# Patient Record
Sex: Male | Born: 1935 | Race: White | Hispanic: No | Marital: Married | Smoking: Former smoker
Health system: Southern US, Community
[De-identification: ages and names within clinical notes are randomized; demographics above are authoritative.]

## PROBLEM LIST (undated history)

## (undated) DIAGNOSIS — I10 Essential (primary) hypertension: Secondary | ICD-10-CM

## (undated) DIAGNOSIS — E785 Hyperlipidemia, unspecified: Secondary | ICD-10-CM

## (undated) DIAGNOSIS — I2510 Atherosclerotic heart disease of native coronary artery without angina pectoris: Secondary | ICD-10-CM

## (undated) DIAGNOSIS — N183 Chronic kidney disease, stage 3 unspecified: Secondary | ICD-10-CM

## (undated) DIAGNOSIS — E119 Type 2 diabetes mellitus without complications: Secondary | ICD-10-CM

## (undated) DIAGNOSIS — K219 Gastro-esophageal reflux disease without esophagitis: Secondary | ICD-10-CM

## (undated) HISTORY — PX: CATARACT EXTRACTION W/ INTRAOCULAR LENS  IMPLANT, BILATERAL: SHX1307

## (undated) HISTORY — PX: CARDIAC CATHETERIZATION: SHX172

---

## 2006-08-27 ENCOUNTER — Ambulatory Visit: Payer: Self-pay | Admitting: Ophthalmology

## 2006-09-02 ENCOUNTER — Ambulatory Visit: Payer: Self-pay | Admitting: Ophthalmology

## 2008-01-11 ENCOUNTER — Ambulatory Visit: Payer: Self-pay | Admitting: Internal Medicine

## 2008-02-08 ENCOUNTER — Ambulatory Visit: Payer: Self-pay | Admitting: Gastroenterology

## 2011-11-23 ENCOUNTER — Inpatient Hospital Stay: Payer: Self-pay | Admitting: Internal Medicine

## 2011-11-23 LAB — COMPREHENSIVE METABOLIC PANEL
Albumin: 3.8 g/dL (ref 3.4–5.0)
Anion Gap: 14 (ref 7–16)
Bilirubin,Total: 0.8 mg/dL (ref 0.2–1.0)
Calcium, Total: 9 mg/dL (ref 8.5–10.1)
Chloride: 103 mmol/L (ref 98–107)
Co2: 23 mmol/L (ref 21–32)
EGFR (Non-African Amer.): 44 — ABNORMAL LOW
Glucose: 321 mg/dL — ABNORMAL HIGH (ref 65–99)
Osmolality: 295 (ref 275–301)
Potassium: 3.4 mmol/L — ABNORMAL LOW (ref 3.5–5.1)
SGOT(AST): 34 U/L (ref 15–37)
Sodium: 140 mmol/L (ref 136–145)

## 2011-11-23 LAB — CBC
HCT: 36.1 % — ABNORMAL LOW (ref 40.0–52.0)
HGB: 12.3 g/dL — ABNORMAL LOW (ref 13.0–18.0)
MCH: 30.8 pg (ref 26.0–34.0)
MCHC: 34.1 g/dL (ref 32.0–36.0)
MCV: 90 fL (ref 80–100)
RBC: 4 10*6/uL — ABNORMAL LOW (ref 4.40–5.90)
WBC: 16.6 10*3/uL — ABNORMAL HIGH (ref 3.8–10.6)

## 2011-11-23 LAB — PRO B NATRIURETIC PEPTIDE: B-Type Natriuretic Peptide: 2328 pg/mL — ABNORMAL HIGH (ref 0–450)

## 2011-11-23 LAB — CK TOTAL AND CKMB (NOT AT ARMC)
CK, Total: 170 U/L (ref 35–232)
CK, Total: 928 U/L — ABNORMAL HIGH (ref 35–232)
CK-MB: 4.7 ng/mL — ABNORMAL HIGH (ref 0.5–3.6)
CK-MB: 84.8 ng/mL — ABNORMAL HIGH (ref 0.5–3.6)
CK-MB: 33 ng/mL — ABNORMAL HIGH (ref 0.5–3.6)

## 2011-11-23 LAB — PROTIME-INR: INR: 1.1

## 2011-11-23 LAB — APTT: Activated PTT: 26.9 secs (ref 23.6–35.9)

## 2011-11-23 LAB — TROPONIN I
Troponin-I: 0.91 ng/mL — ABNORMAL HIGH
Troponin-I: 19 ng/mL — ABNORMAL HIGH

## 2011-11-24 LAB — CBC WITH DIFFERENTIAL/PLATELET
Basophil #: 0 10*3/uL (ref 0.0–0.1)
Basophil %: 0.1 %
Eosinophil #: 0 10*3/uL (ref 0.0–0.7)
Eosinophil %: 0.2 %
HCT: 31.6 % — ABNORMAL LOW (ref 40.0–52.0)
Lymphocyte #: 1.6 10*3/uL (ref 1.0–3.6)
Lymphocyte %: 15.1 %
MCH: 30.6 pg (ref 26.0–34.0)
MCHC: 33.9 g/dL (ref 32.0–36.0)
MCV: 90 fL (ref 80–100)
Monocyte #: 1.3 10*3/uL — ABNORMAL HIGH (ref 0.0–0.7)
Monocyte %: 12.1 %
Neutrophil %: 72.5 %
Platelet: 155 10*3/uL (ref 150–440)
RBC: 3.5 10*6/uL — ABNORMAL LOW (ref 4.40–5.90)
RDW: 14.6 % — ABNORMAL HIGH (ref 11.5–14.5)
WBC: 10.3 10*3/uL (ref 3.8–10.6)

## 2011-11-24 LAB — BASIC METABOLIC PANEL
Anion Gap: 13 (ref 7–16)
Calcium, Total: 8.6 mg/dL (ref 8.5–10.1)
Chloride: 97 mmol/L — ABNORMAL LOW (ref 98–107)
Co2: 27 mmol/L (ref 21–32)
Creatinine: 1.72 mg/dL — ABNORMAL HIGH (ref 0.60–1.30)
Sodium: 137 mmol/L (ref 136–145)

## 2011-11-24 LAB — LIPID PANEL
Cholesterol: 130 mg/dL (ref 0–200)
HDL Cholesterol: 47 mg/dL (ref 40–60)
Ldl Cholesterol, Calc: 54 mg/dL (ref 0–100)
Triglycerides: 147 mg/dL (ref 0–200)
VLDL Cholesterol, Calc: 29 mg/dL (ref 5–40)

## 2011-11-25 LAB — BASIC METABOLIC PANEL
BUN: 42 mg/dL — ABNORMAL HIGH (ref 7–18)
Chloride: 98 mmol/L (ref 98–107)
Co2: 26 mmol/L (ref 21–32)
Creatinine: 2.8 mg/dL — ABNORMAL HIGH (ref 0.60–1.30)
EGFR (Non-African Amer.): 23 — ABNORMAL LOW
Potassium: 4.2 mmol/L (ref 3.5–5.1)
Sodium: 137 mmol/L (ref 136–145)

## 2011-11-25 LAB — MAGNESIUM: Magnesium: 2.1 mg/dL

## 2011-11-25 LAB — CBC WITH DIFFERENTIAL/PLATELET
Basophil #: 0 10*3/uL (ref 0.0–0.1)
Eosinophil #: 0.1 10*3/uL (ref 0.0–0.7)
Lymphocyte #: 1.3 10*3/uL (ref 1.0–3.6)
Monocyte #: 1.1 10*3/uL — ABNORMAL HIGH (ref 0.0–0.7)
Neutrophil %: 68.3 %
Platelet: 155 10*3/uL (ref 150–440)
RDW: 14.4 % (ref 11.5–14.5)
WBC: 8 10*3/uL (ref 3.8–10.6)

## 2011-11-26 LAB — CBC WITH DIFFERENTIAL/PLATELET
Basophil %: 0.4 %
Eosinophil %: 4.4 %
HGB: 10.1 g/dL — ABNORMAL LOW (ref 13.0–18.0)
Lymphocyte %: 18.2 %
Monocyte %: 12.4 %
Neutrophil %: 64.6 %
Platelet: 168 10*3/uL (ref 150–440)
RBC: 3.26 10*6/uL — ABNORMAL LOW (ref 4.40–5.90)
WBC: 7.3 10*3/uL (ref 3.8–10.6)

## 2011-11-26 LAB — BASIC METABOLIC PANEL
Anion Gap: 11 (ref 7–16)
BUN: 19 mg/dL — ABNORMAL HIGH (ref 7–18)
Calcium, Total: 8.3 mg/dL — ABNORMAL LOW (ref 8.5–10.1)
Co2: 25 mmol/L (ref 21–32)
Creatinine: 3.62 mg/dL — ABNORMAL HIGH (ref 0.60–1.30)
EGFR (Non-African Amer.): 17 — ABNORMAL LOW
Glucose: 102 mg/dL — ABNORMAL HIGH (ref 65–99)
Osmolality: 276 (ref 275–301)
Potassium: 4.4 mmol/L (ref 3.5–5.1)
Sodium: 137 mmol/L (ref 136–145)

## 2011-11-27 LAB — CBC WITH DIFFERENTIAL/PLATELET
Basophil %: 0.6 %
Eosinophil #: 0.6 10*3/uL (ref 0.0–0.7)
Eosinophil %: 8.8 %
HCT: 29.7 % — ABNORMAL LOW (ref 40.0–52.0)
Lymphocyte #: 1.4 10*3/uL (ref 1.0–3.6)
MCH: 30.7 pg (ref 26.0–34.0)
MCHC: 33.5 g/dL (ref 32.0–36.0)
MCV: 92 fL (ref 80–100)
Monocyte #: 0.8 10*3/uL — ABNORMAL HIGH (ref 0.0–0.7)
Monocyte %: 11.3 %
Neutrophil #: 4.4 10*3/uL (ref 1.4–6.5)
Neutrophil %: 60.4 %
Platelet: 178 10*3/uL (ref 150–440)
RBC: 3.24 10*6/uL — ABNORMAL LOW (ref 4.40–5.90)
RDW: 14.2 % (ref 11.5–14.5)
WBC: 7.2 10*3/uL (ref 3.8–10.6)

## 2011-11-27 LAB — BASIC METABOLIC PANEL
Anion Gap: 12 (ref 7–16)
BUN: 51 mg/dL — ABNORMAL HIGH (ref 7–18)
Calcium, Total: 8.2 mg/dL — ABNORMAL LOW (ref 8.5–10.1)
Chloride: 105 mmol/L (ref 98–107)
Co2: 23 mmol/L (ref 21–32)
Glucose: 109 mg/dL — ABNORMAL HIGH (ref 65–99)
Osmolality: 294 (ref 275–301)
Potassium: 4.5 mmol/L (ref 3.5–5.1)
Sodium: 140 mmol/L (ref 136–145)

## 2011-11-27 LAB — URINALYSIS, COMPLETE
Bilirubin,UR: NEGATIVE
Ketone: NEGATIVE
Ph: 6 (ref 4.5–8.0)
Protein: 30
Specific Gravity: 1.01 (ref 1.003–1.030)
Squamous Epithelial: NONE SEEN
WBC UR: 4 /HPF (ref 0–5)

## 2011-11-27 LAB — IRON AND TIBC
Iron: 22 ug/dL — ABNORMAL LOW (ref 65–175)
Unbound Iron-Bind.Cap.: 281 ug/dL

## 2011-11-27 LAB — PHOSPHORUS: Phosphorus: 3.9 mg/dL (ref 2.5–4.9)

## 2011-11-27 LAB — PROTEIN / CREATININE RATIO, URINE
Creatinine, Urine: 54.5 mg/dL (ref 30.0–125.0)
Protein, Random Urine: 45 mg/dL — ABNORMAL HIGH (ref 0–12)
Protein/Creat. Ratio: 826 mg/gCREAT — ABNORMAL HIGH (ref 0–200)

## 2011-11-28 LAB — CBC WITH DIFFERENTIAL/PLATELET
Basophil #: 0.1 10*3/uL (ref 0.0–0.1)
Basophil %: 0.9 %
Eosinophil %: 9.8 %
HGB: 10.1 g/dL — ABNORMAL LOW (ref 13.0–18.0)
Lymphocyte %: 18.7 %
MCH: 32.3 pg (ref 26.0–34.0)
MCHC: 34 g/dL (ref 32.0–36.0)
Monocyte #: 0.9 10*3/uL — ABNORMAL HIGH (ref 0.0–0.7)
Monocyte %: 10.8 %
Neutrophil %: 59.8 %
Platelet: 224 10*3/uL (ref 150–440)
RBC: 3.13 10*6/uL — ABNORMAL LOW (ref 4.40–5.90)
WBC: 8.5 10*3/uL (ref 3.8–10.6)

## 2011-11-28 LAB — BASIC METABOLIC PANEL
BUN: 39 mg/dL — ABNORMAL HIGH (ref 7–18)
Chloride: 108 mmol/L — ABNORMAL HIGH (ref 98–107)
Creatinine: 2.2 mg/dL — ABNORMAL HIGH (ref 0.60–1.30)
EGFR (African American): 37 — ABNORMAL LOW
EGFR (Non-African Amer.): 31 — ABNORMAL LOW
Glucose: 91 mg/dL (ref 65–99)
Osmolality: 288 (ref 275–301)
Potassium: 4.3 mmol/L (ref 3.5–5.1)
Sodium: 140 mmol/L (ref 136–145)

## 2011-11-28 LAB — PROTEIN ELECTROPHORESIS(ARMC)

## 2011-12-01 LAB — UR PROT ELECTROPHORESIS, URINE RANDOM

## 2012-07-28 ENCOUNTER — Ambulatory Visit: Payer: Self-pay | Admitting: Nephrology

## 2013-03-25 IMAGING — CR DG CHEST 1V PORT
1 series · 1 of 1 positions shown · non-contrast
Comparison: none

REASON FOR EXAM: dyspnea
COMMENTS:

[portable]
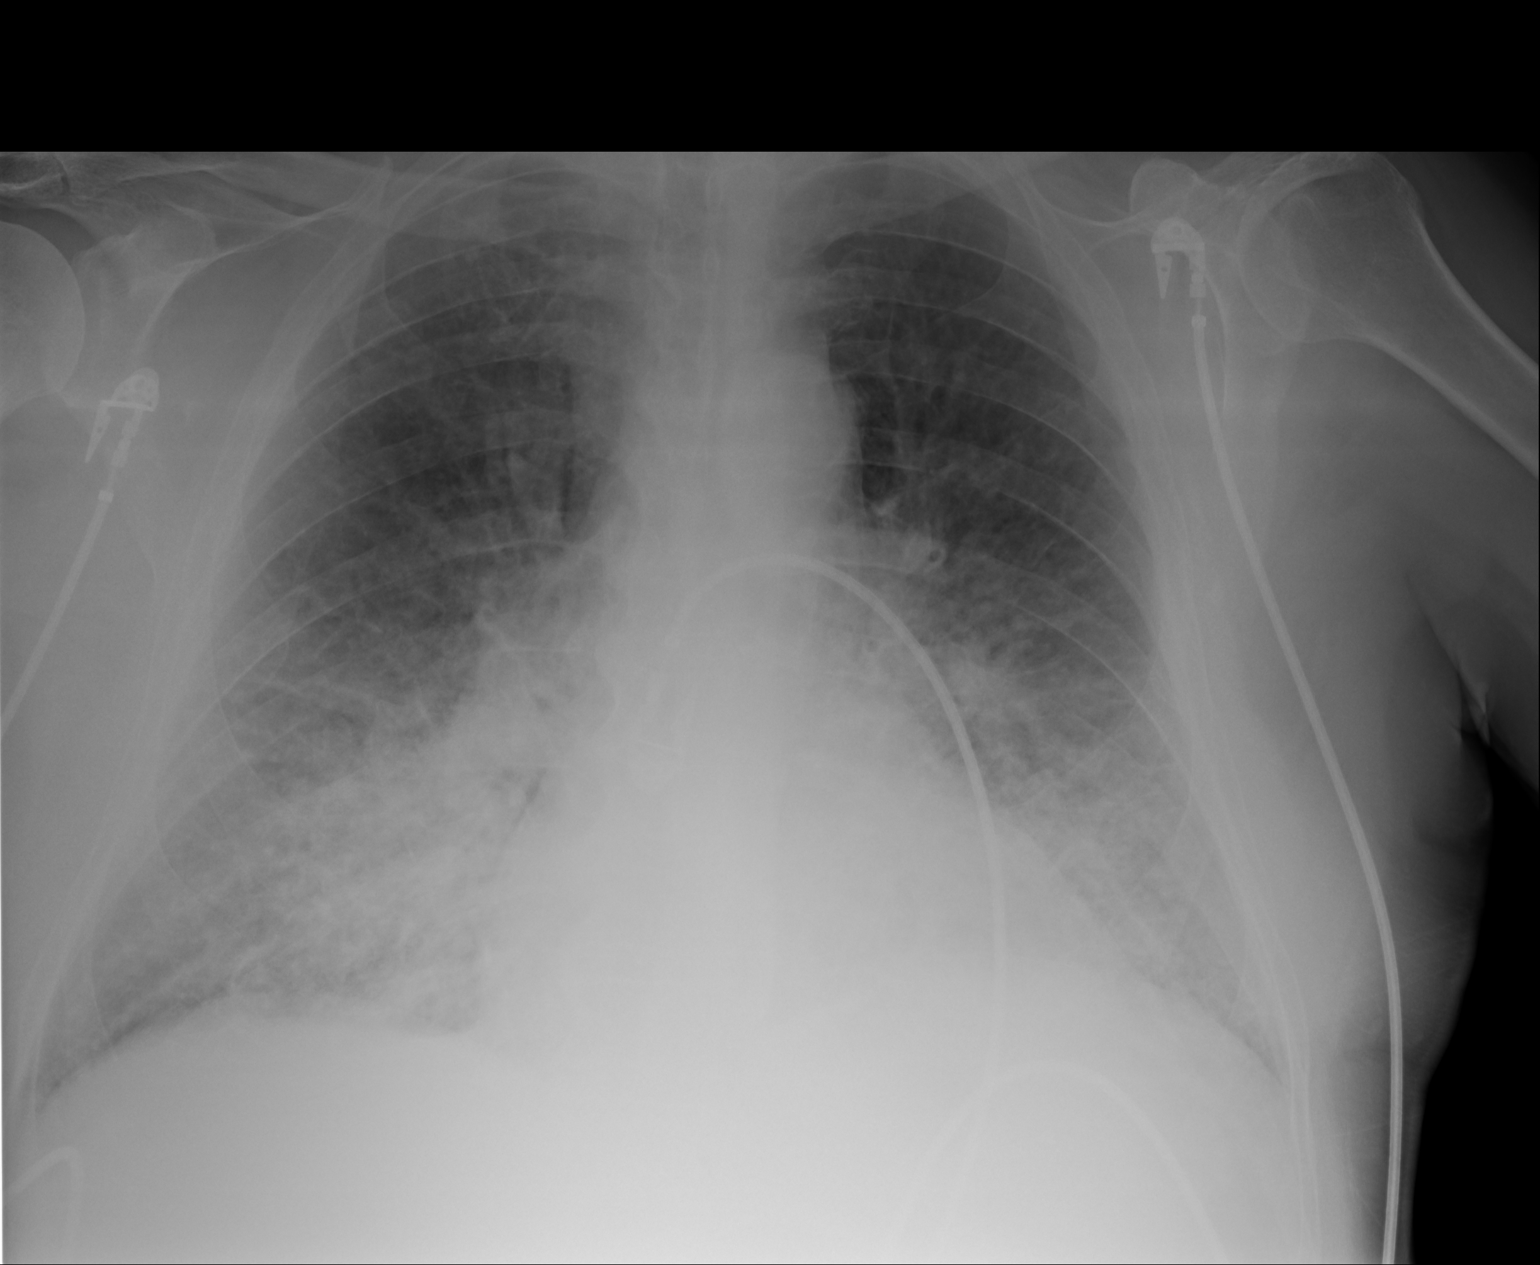

[1 of 1 positions shown; findings below may reference images not displayed]

PROCEDURE:     DXR - DXR PORTABLE CHEST SINGLE VIEW  - November 23, 2011  [DATE]

RESULT:     The lungs are well-expanded. The pulmonary interstitial markings
are increased diffusely. There is confluent alveolar density in the inferior
perihilar regions. The cardiac silhouette is not enlarged. The pulmonary
vascularity is prominent.
IMPRESSION: The findings are consistent with acute pulmonary
interstitial and alveolar edema.

## 2013-03-26 IMAGING — CR DG CHEST 1V PORT
1 series · 1 of 1 positions shown · non-contrast
Comparison: none

REASON FOR EXAM: follow up on CHF
COMMENTS:   LMP: (Male)

[ap]
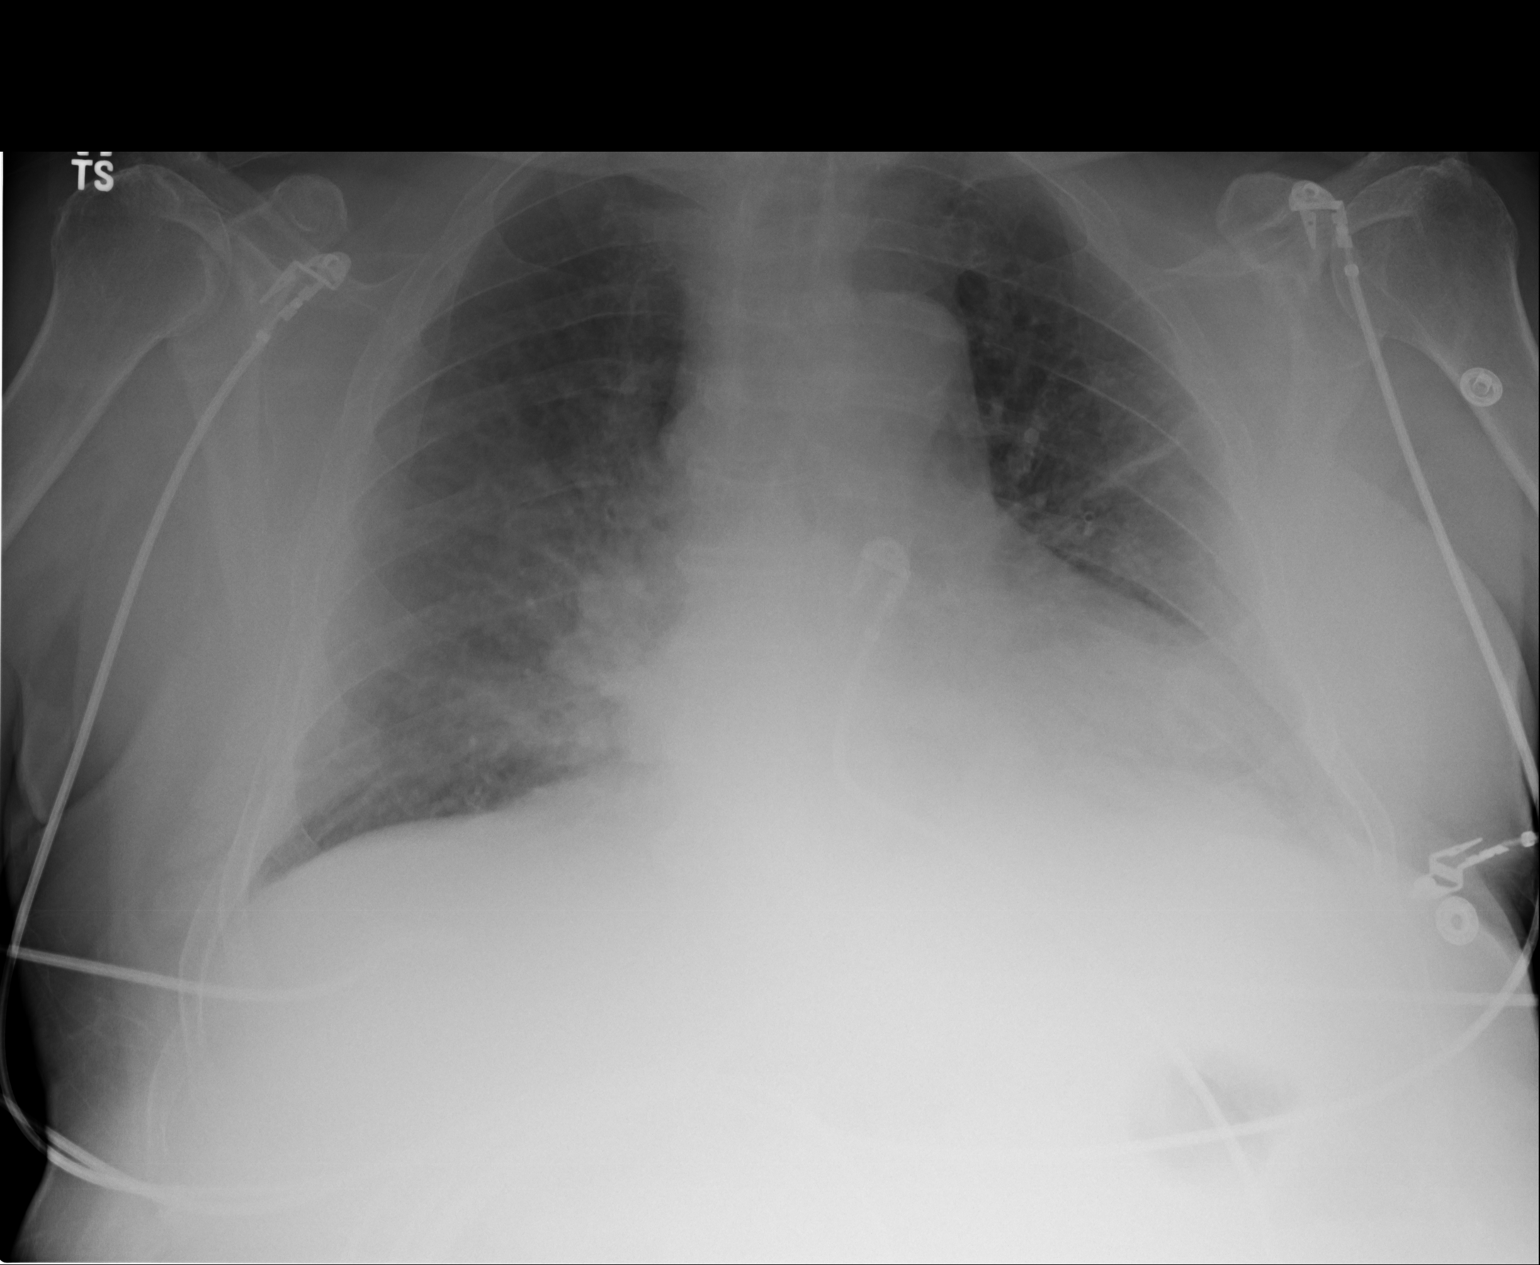

[1 of 1 positions shown; findings below may reference images not displayed]

PROCEDURE:     DXR - DXR PORTABLE CHEST SINGLE VIEW  - November 24, 2011  [DATE]

RESULT:     Comparison is made to the study 23 November, 2011.

There has been an improvement in the appearance of the pulmonary
interstitium and pulmonary vascularity. Engorgement remains but it is less
conspicuous. The cardiac silhouette remains enlarged. I see no pleural
effusion.
IMPRESSION: There has been interval improvement in the the pattern of
pulmonary edema, but abnormality remains. When the patient can tolerate the
procedure, a PA and lateral chest x-ray would be of value.

## 2013-03-29 IMAGING — CR DG CHEST 1V PORT
1 series · 1 of 1 positions shown · non-contrast
Comparison: none

REASON FOR EXAM: CHF
COMMENTS:

PROCEDURE:     DXR - DXR PORTABLE CHEST SINGLE VIEW  - November 27, 2011  [DATE]
RESULT:     Comparison is made to study 25 November, 2011.
The lungs are mildly hyperinflated. The cardiac silhouette is enlarged. The
perihilar lung markings are increased. There is no pleural effusion.

[portable]
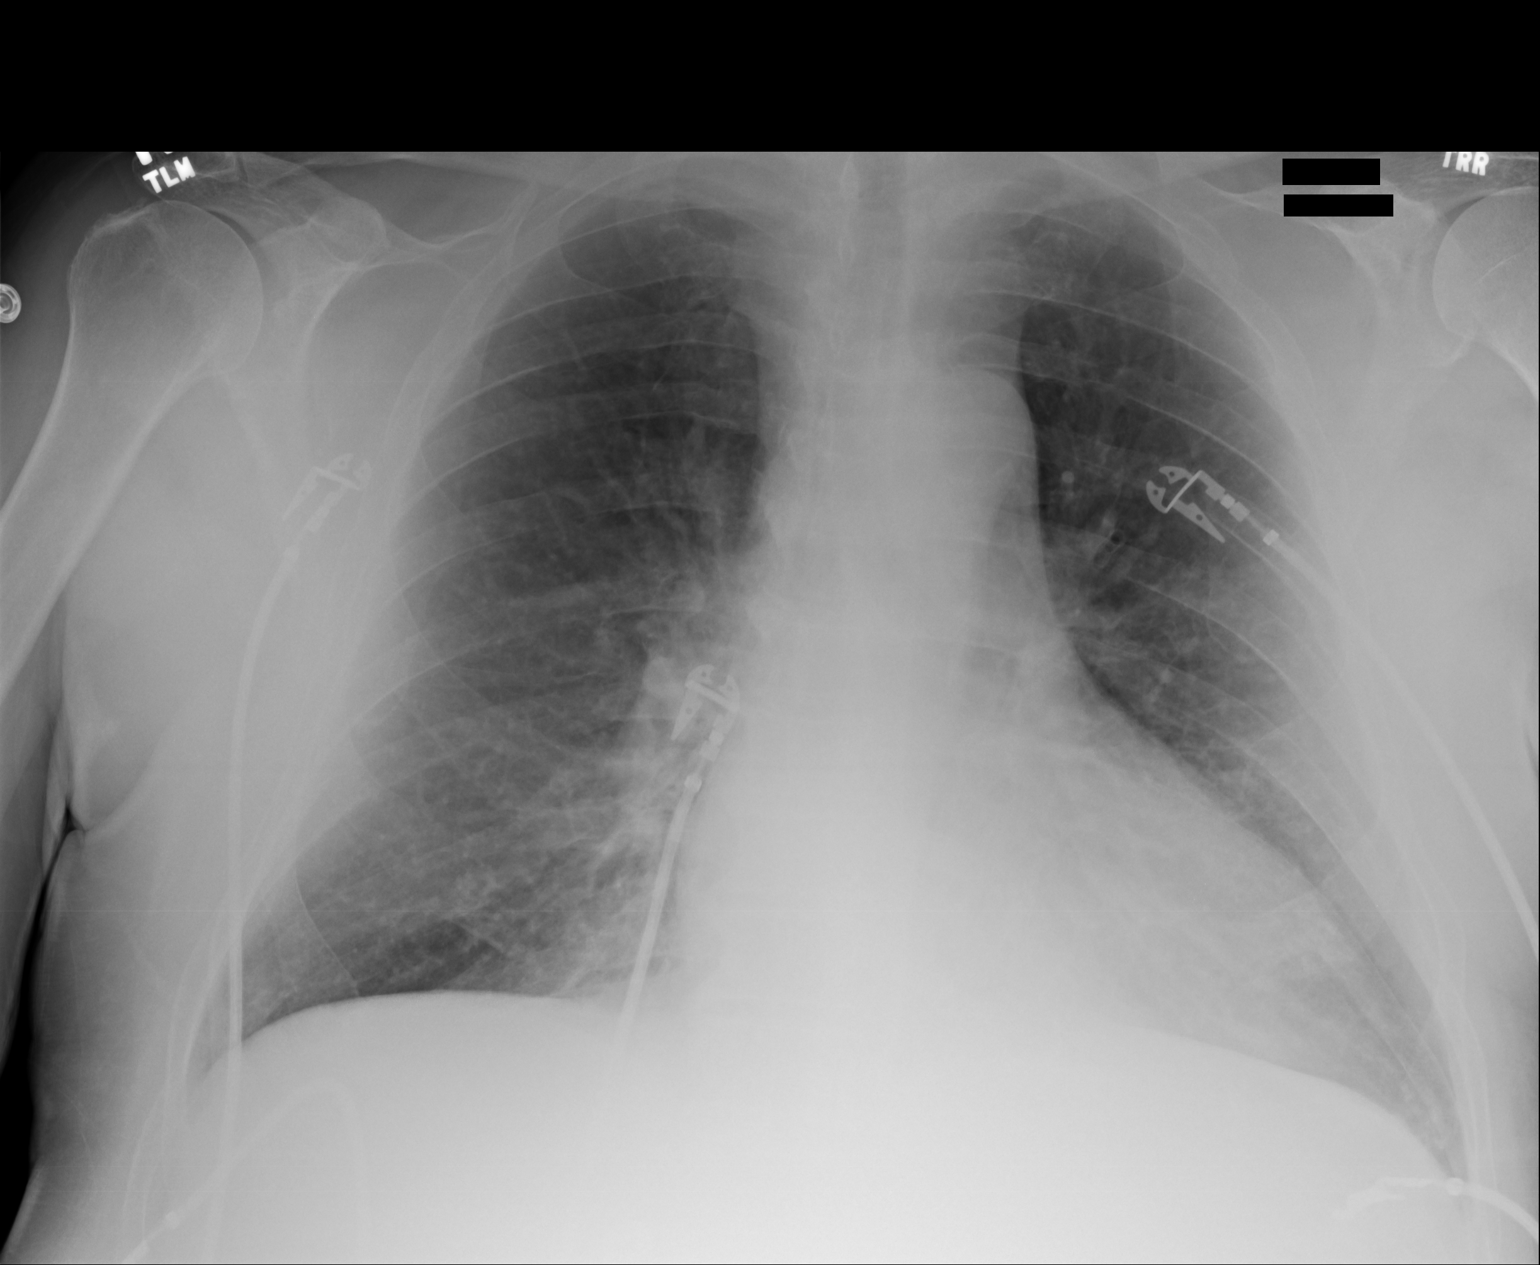

[1 of 1 positions shown; findings below may reference images not displayed]

IMPRESSION: The findings suggest low-grade CHF. There may be underlying
COPD. I see no alveolar infiltrate or pleural effusion. A followup PA and
lateral chest x-ray would be of value.

## 2013-11-28 IMAGING — CT CT ABD-PELV W/O CM
1 of 2 series · 14 of 32 positions shown, 18 images · IV contrast (agent unspecified)
Comparison: none

REASON FOR EXAM: renal mass
COMMENTS:

PROCEDURE:     KCT - KCT ABDOMEN/PELVIS WO  - July 28, 2012  [DATE]
RESULT:
TECHNIQUE: Noncontrast CT of the abdomen and pelvis is reconstructed at 3 mm
slice thickness in the axial plane. There is no previous similar study for
comparison. There is a previous Renal Ultrasound from 27 November, 2011 which
recommended follow-up CT. A triphasic CT is typically optimal for evaluation
of renal masses. If the patient does not have contraindication to contrast,
additional CT imaging with contrast and post contrast delayed studies would
be recommended.

[Series 2: abd 3mm wo 3.0 i40f 3 · axial · 0.84mm/px · z∈[-936,-576]mm · 14 of 135 slices shown, 18 images]
[im 10/135  soft-tissue]
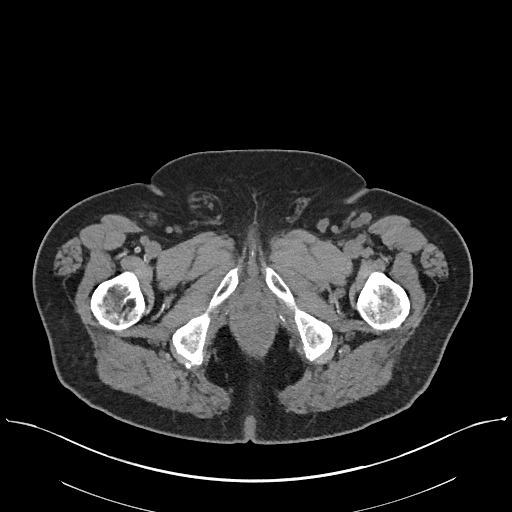
[im 10/135  bone]
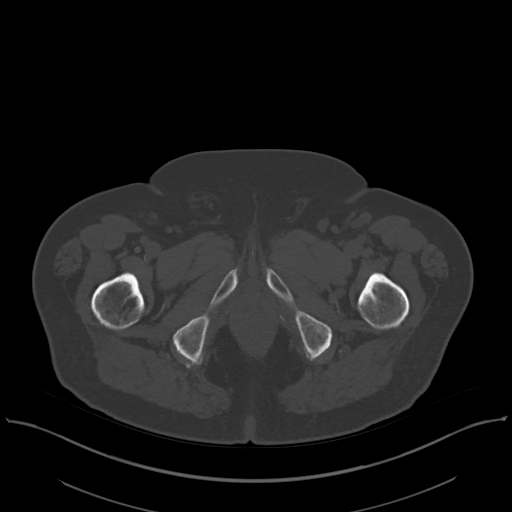
[im 20/135  soft-tissue]
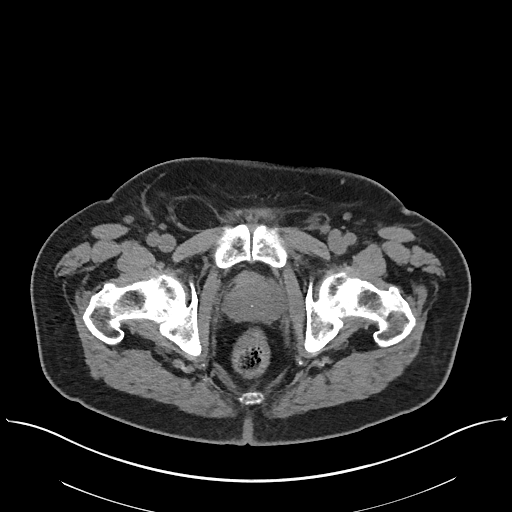
[im 30/135  soft-tissue]
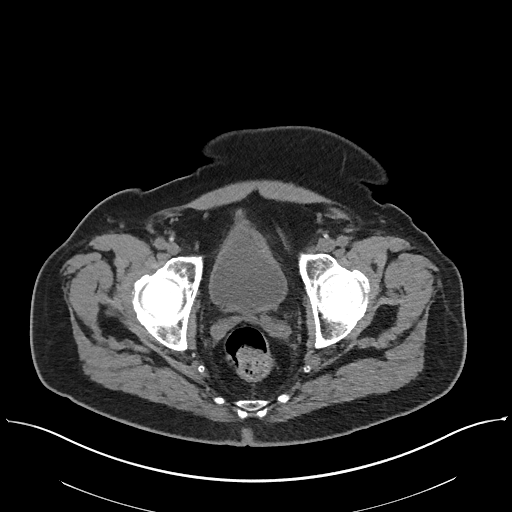
[im 40/135  soft-tissue]
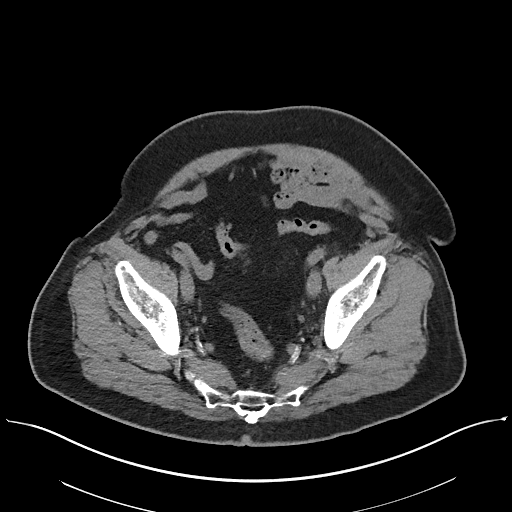
[im 50/135  soft-tissue]
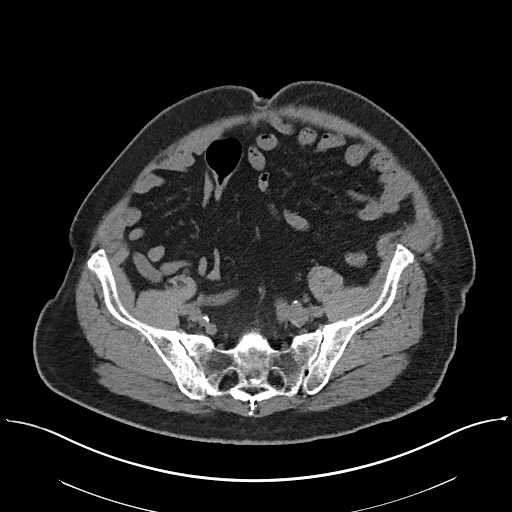
[im 60/135  soft-tissue]
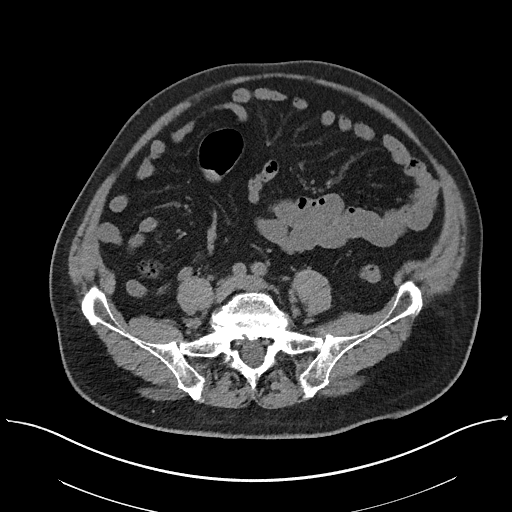
[im 75/135  soft-tissue]
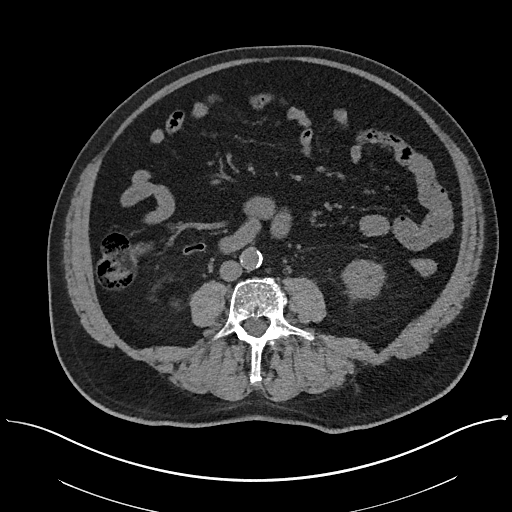
[im 85/135  soft-tissue]
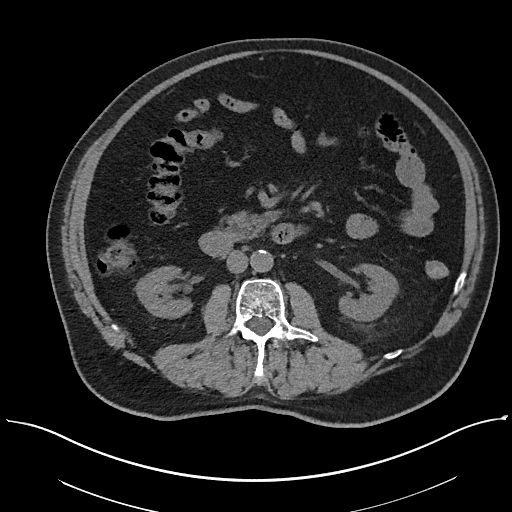
[im 95/135  soft-tissue]
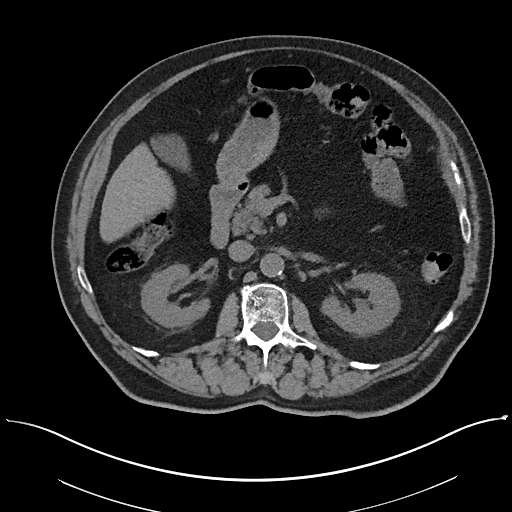
[im 95/135  bone]
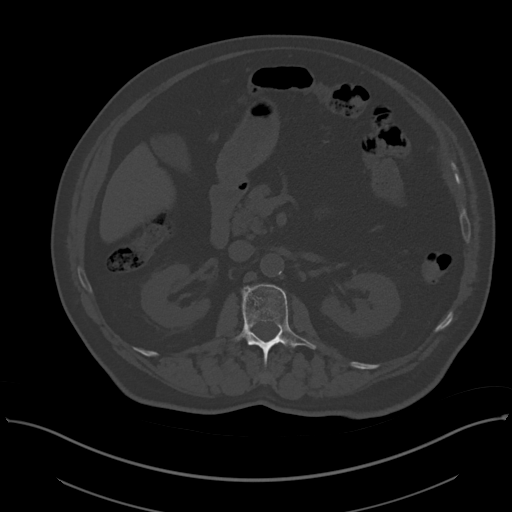
[im 105/135  soft-tissue]
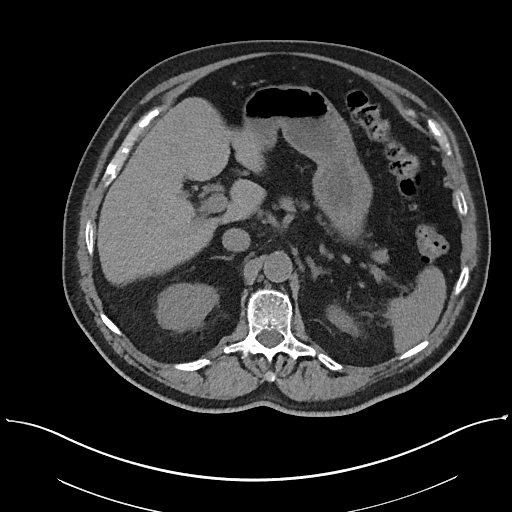
[im 115/135  soft-tissue]
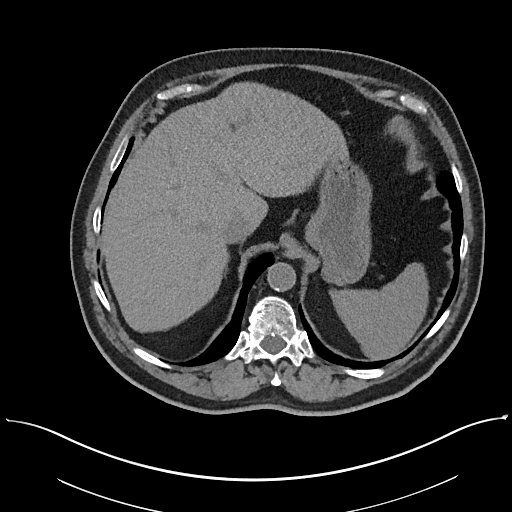
[im 115/135  lung]
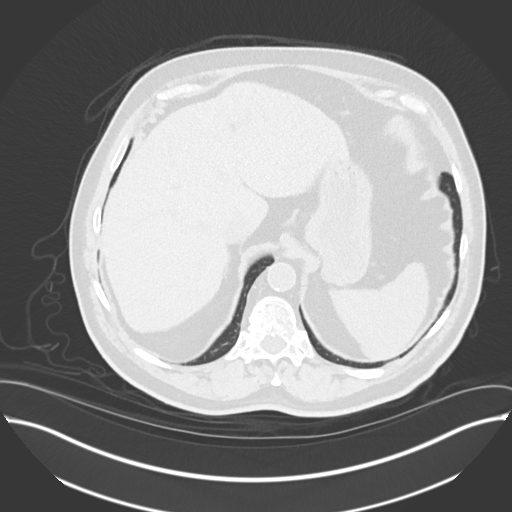
[im 120/135  lung]
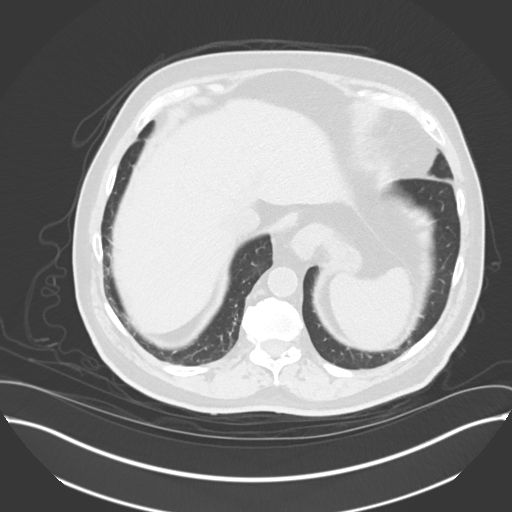
[im 125/135  soft-tissue]
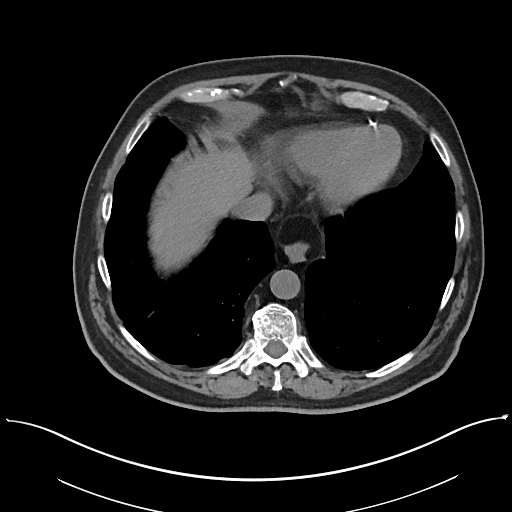
[im 125/135  lung]
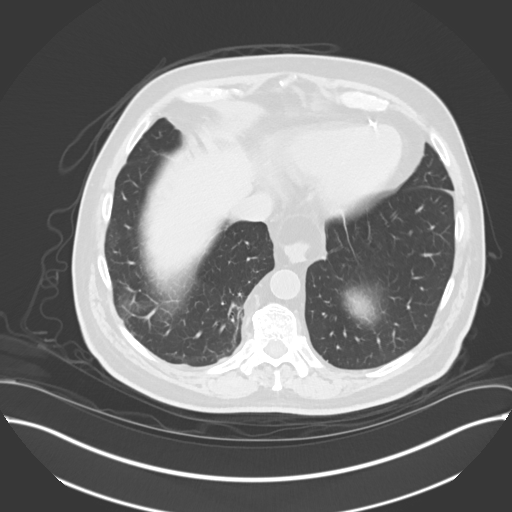
[im 130/135  lung]
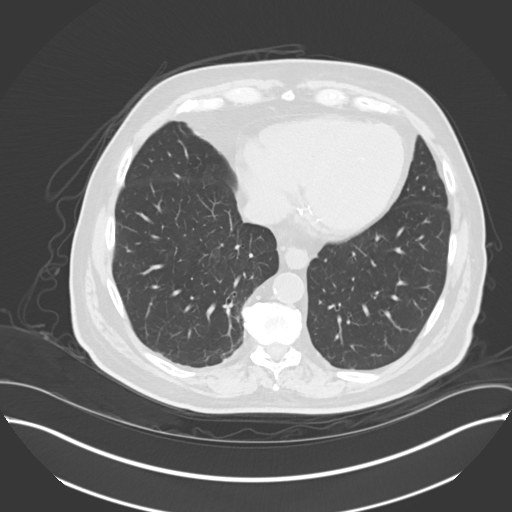

[14 of 32 positions shown; findings below may reference images not displayed]

FINDINGS: The lung bases show some subpleural fibrosis or atelectasis. There
is no effusion or pneumothorax. No pulmonary mass is appreciated. Within the
lower pole of the right kidney, there is a 2.5 cm diameter lesion with a
Hounsfield reading of 8.0 containing some calcification along the anterior
wall on images 56 and 57 suggestive of an atypical cystic mass. It is
uncertain if this is benign or malignant. If there is no contraindication to
iodinated intravenous contrast, post contrast images are suggested.
Scattered colonic diverticular disease is present especially in the sigmoid
colon without evidence of an acute inflammatory process to suggest acute
diverticulitis. There is a fat filled, right inguinal hernia. The appendix
is normal. No abnormal bowel distention or wall thickening is evident.
Perinephric stranding is present bilaterally. Minimal calcification is seen
within the right lobe of the liver on image 24. There is mild thickening of
the distal esophagus which is nonspecific. Correlate for esophagitis. The
visualized lung bases as noted above contains some presumed atelectasis or
fibrosis. The spleen, pancreas and abdominal aorta appear to be grossly
within normal limits. Scattered atherosclerotic calcification is present.
The urinary bladder is decompressed without stones. No radiopaque gallstones
are evident. The adrenal glands appear unremarkable.
IMPRESSION: 1.  Atypical cystic mass in the lower pole of the right kidney. Additional
contrast and post contrast images would be helpful for optimal evaluation.
2.  Presumed chronic pulmonary disease at the lung bases.
3.  Atherosclerotic calcification.
4.  Some calcification is noted in the liver and may represent punctate
granulomatous calcification.
5.  Colonic diverticular disease without evidence of acute diverticulitis.
6.  Fat filled, right inguinal hernia.

[REDACTED]

## 2014-01-16 DIAGNOSIS — E119 Type 2 diabetes mellitus without complications: Secondary | ICD-10-CM | POA: Insufficient documentation

## 2014-01-16 DIAGNOSIS — Z9861 Coronary angioplasty status: Secondary | ICD-10-CM

## 2014-01-16 DIAGNOSIS — I509 Heart failure, unspecified: Secondary | ICD-10-CM | POA: Insufficient documentation

## 2014-01-16 DIAGNOSIS — I2510 Atherosclerotic heart disease of native coronary artery without angina pectoris: Secondary | ICD-10-CM | POA: Insufficient documentation

## 2014-01-16 DIAGNOSIS — I10 Essential (primary) hypertension: Secondary | ICD-10-CM | POA: Insufficient documentation

## 2014-01-16 DIAGNOSIS — I519 Heart disease, unspecified: Secondary | ICD-10-CM | POA: Insufficient documentation

## 2014-03-31 DIAGNOSIS — N183 Chronic kidney disease, stage 3 unspecified: Secondary | ICD-10-CM | POA: Insufficient documentation

## 2014-03-31 DIAGNOSIS — K2970 Gastritis, unspecified, without bleeding: Secondary | ICD-10-CM | POA: Insufficient documentation

## 2014-03-31 DIAGNOSIS — D649 Anemia, unspecified: Secondary | ICD-10-CM | POA: Insufficient documentation

## 2014-03-31 DIAGNOSIS — E785 Hyperlipidemia, unspecified: Secondary | ICD-10-CM | POA: Insufficient documentation

## 2015-02-04 NOTE — H&P (Signed)
PATIENT NAME:  Darrell Dickerson, Darrell Dickerson MR#:  [id number] DATE OF BIRTH:  25-Oct-1930  DATE OF ADMISSION:  11/23/2011  PRIMARY CARE PHYSICIAN: Fulton Reek, MD  CHIEF COMPLAINT: Respiratory distress.   HISTORY OF PRESENT ILLNESS: Darrell Dickerson is a 81 year old pleasant Caucasian male with past medical history of systemic hypertension and diabetes mellitus. He was in his usual state of health until about 5 or 6 days ago when he started to have cough and is associated with mild sputum production. This had progressed and got worse over the last 24 hours. He became more short of breath until he reached the level that he became in respiratory distress. At that point, the family brought him to the Emergency Department for further evaluation and management. The patient denies having any fever throughout his last several days and denies having any chest pain other than his chest got sore upon coughing after several days of persistent cough. It was also noted that he has significant peripheral edema. This is not new but has been observed by the family for quite a while, and it just got worse lately.    REVIEW OF SYSTEMS: CONSTITUTIONAL: Patient denies having any fever. No chills. No fatigue. No night sweats. EYES: No blurring of vision. No double vision. ENT: No hearing impairment. No sore throat. No dysphagia. CARDIOVASCULAR: No chest pain. He admits having shortness of breath and peripheral edema. No syncope. RESPIRATORY: The patient is in respiratory difficulty and coughing for several days. No chest pain. GASTROINTESTINAL: No nausea, no vomiting. No abdominal pain. No diarrhea. GENITOURINARY: No dysuria or frequency of urination. MUSCULOSKELETAL: No joint swelling or pain. No muscular pain or swelling. INTEGUMENTARY: No skin rash. No ulcers. NEUROLOGIC: No focal weakness. No seizure activity. No headache. PSYCHIATRY: No anxiety or depression. ENDOCRINE: No night sweats, heat or cold intolerance.   PAST MEDICAL HISTORY:   1. History of systemic hypertension.  2. History of diabetes mellitus, type 2.  3. History of gastroesophageal reflux disease.  4. The patient has no prior history of coronary artery disease.    PAST SURGICAL HISTORY: Cataract surgery.   FAMILY HISTORY: His father died from complications of coronary artery disease at the age of 30. He was hypertensive as well. There is no family history of diabetes.   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 He is married, living with his wife. Social habits: Nonsmoker. No history of alcohol abuse.   ADMISSION MEDICATIONS:  1. Amlodipine 10 mg a day. 2. Losartan with hydrochlorothiazide 100/25 once a day.  3. Atenolol 50 mg once a day. 4. Actos 30 mg once a day. 5. Glyburide 6 mg, 2 tablets twice a day.  6. Janumet 50/1000 mg twice a day. 7. Omeprazole 20 mg once a day. 8. Hydrochlorothiazide once a day.  9. Folic acid 1 mg a day.   ALLERGIES: No known drug allergies.   PHYSICAL EXAMINATION:  VITAL SIGNS: Blood pressure 164/83, with a pulse of 95, respiratory rate 26. Oxygen saturation was 92% on 100% nonrebreather. His temperature is 98.2.   GENERAL APPEARANCE: An elderly male who looks in respiratory difficulty sitting on the stretcher.   HEENT: Head: No pallor. No icterus. No cyanosis. Ears, nose and throat: Hearing was normal. Nasal mucosa, lips, and tongue were normal. Eyes: Normal iris and conjunctivae. Pupils about 4 mm, equal and reactive to light.   NECK: Supple. Trachea at midline. No thyromegaly. No cervical lymphadenopathy. No masses.   HEART: Normal S1, S2. Difficult to  auscultate for any murmur due to respiratory difficulty and scattered rhonchi. No murmur was appreciated. No carotid bruits.   LUNGS: The patient is tachypneic. There are diffuse fine crackles in both lung fields and scattered rhonchi.  ABDOMEN: Morbidly obese, soft without tenderness. No hepatosplenomegaly. No masses. No hernias.    SKIN: No ulcers. No subcutaneous nodules. There is peripheral edema extending from the ankle up to midway to the knees. about + 2 to + 3. This is bilateral.   MUSCULOSKELETAL: No joint swelling. No clubbing.   NEUROLOGICAL: Cranial nerves II through XII are intact. No focal motor deficit.   PSYCHIATRIC: The patient is alert and oriented x3. Mood and affect were flat.   LABORATORY, DIAGNOSTIC AND RADIOLOGICAL DATA:  His EKG showed normal sinus rhythm at rate of 87 per minute, left axis deviation, Q waves in leads III and aVF. Poor progression of R waves in the anterior chest leads. Nonspecific T wave abnormalities.  Chest x-ray showed heart size is upper limits of normal. There are diffuse opacities in both lungs fields more consistent with pulmonary edema and increased pulmonary vasculature. I cannot rule out underlying pneumonia.  Serum glucose was 321, BUN 21, creatinine 1.6, with estimated GFR at 44. Serum sodium 140, potassium 3.4.  His B-type natriuretic peptide was elevated at 2328.  Liver function tests were normal.  CPK was normal at 170. CK-MB fraction elevated at 4.7. Troponin is elevated at 0.9.  CBC showed a white count of 16,000, hemoglobin 12, hematocrit 36, platelet count 206, normal MCV, MCH and MCHC.  Prothrombin time 14, INR 1.1, APTT 26.  ABG on FiO2 of 100% showed a pH of 7.33, pCO2 44, pO2 64.    IMPRESSION:  1. Acute respiratory failure most likely secondary to acute congestive heart failure with pulmonary edema the culprit. I cannot rule out underlying pneumonia, but likely the presentation is more consistent with congestive heart failure and volume overload.  2. Pulmonary edema.  3. Elevated troponin raises the question of whether the patient had or had not non-ST elevation acute myocardial infarction. The other possibility is elevated troponin secondary to increased demand due to respiratory difficulty.  4. Elevated creatinine. At this point, I do not have a  baseline for him to determine whether this is acute or chronic. If this is chronic, then he has stage III chronic kidney disease. Further monitoring will be needed.  5. Mild anemia. This is normocytic normochromic .  6. Systemic hypertension.  7. Diabetes mellitus, type 2.  8. Mild hypokalemia.   PLAN: The patient is now treated with 100% nonrebreather oxygen supply along with a dose of IV Lasix 60 mg. The patient started to improve, and he tells me that he is feeling much better. We will admit him to the Intensive Care Unit, follow up on his cardiac enzymes. I will continue Lasix 40 mg IV every 8 hours. Potassium replacement. Cardiology consultation. Obtain echocardiogram. I will repeat the chest x-ray tomorrow to follow up on the improvement of his congestive heart failure and also to ensure there is no underlying pneumonia. I did not give the patient antibiotic. I do not think it is necessary at this point  since he has responded to the initial treatment of combating the volume overload. I will place the patient on insulin sliding scale. I will continue his home medications, but I will discontinue Actos given the fact that now he has volume overload and severe congestive heart failure. Follow up on his BUN and  creatinine, potassium and BNP. The patient's kidney function needs to be monitored to determine whether this patient needs to continue on metformin or to discontinue this medication indefinitely. The decision now is borderline given his creatinine of 1.6.   I reviewed his old records. There is only an admission here for upper endoscopy a few years ago.   TIME SPENT:   Time needed to evaluate this patient and discussion with the patient and his wife and his daughter was more than 55 minutes.   ____________________________ Clovis Pu. Lenore Manner, MD amd:cbb D: 11/23/2011 04:13:49 ET T: 11/23/2011 09:32:12 ET JOB#: [id number]  cc: Clovis Pu. Lenore Manner, MD, <Dictator> Leonie Douglas. Doy Hutching, MD Ellin Saba  MD ELECTRONICALLY SIGNED 11/23/2011 22:14

## 2015-02-04 NOTE — Consult Note (Signed)
Brief Consult Note: Diagnosis: New onset CHF, elevated trop and CPK-MB.   Patient was seen by consultant.   Consult note dictated.   Comments: REC  Agree with current therapy, cont diuresis, hold metformin, review echo, cardiac cath in am.  Electronic Signatures: Isaias Cowman (MD)  (Signed 10-Feb-13 10:41)  Authored: Brief Consult Note   Last Updated: 10-Feb-13 10:41 by Isaias Cowman (MD)

## 2015-02-04 NOTE — Discharge Summary (Signed)
PATIENT NAME:  Darrell Dickerson, Darrell Dickerson MR#:  [id number] DATE OF BIRTH:  [birth year]  DATE OF ADMISSION:  11/23/2011 DATE OF DISCHARGE:  11/28/2011  REASON FOR ADMISSION: Shortness of breath.   HISTORY OF PRESENT ILLNESS: Please see the dictated history of present illness done by Dr. Lenore Manner on 11/23/2011.   PAST MEDICAL HISTORY:  1. Benign hypertension.  2. Type 2 diabetes.  3. Gastroesophageal reflux disease.  4. Status post cataract surgery.   MEDICATIONS ON ADMISSION: Please see admission note.   ALLERGIES: No known drug allergies.   SOCIAL HISTORY, FAMILY HISTORY AND REVIEW OF SYSTEMS: As per admission note.   PHYSICAL EXAM: GENERAL: The patient was acutely ill-appearing in moderate respiratory distress. VITAL SIGNS: Vital signs remarkable for a blood pressure of 164/83 with a heart rate of 95 and a respiratory rate of 26. He was afebrile. HEENT exam was unremarkable. NECK: Supple without thyromegaly. LUNGS: Crackles bilaterally. CARDIAC: Regular rate and rhythm with normal S1, S2. ABDOMEN: Soft, nontender, with normoactive bowel sounds. EXTREMITIES: 2+ edema. NEUROLOGIC: Grossly nonfocal.   LABORATORY, DIAGNOSTIC AND RADIOLOGICAL DATA: Chest x-ray revealed congestive heart failure. BNP was 2,328. CK was 170 with a MB of 4.7 and a troponin of 0.9.   HOSPITAL COURSE: The patient was admitted with acute respiratory failure with new onset congestive heart failure. He was admitted to the Intensive Care Unit with IV Lasix. Serial cardiac enzymes returned positive for an acute myocardial infarction with elevated troponin. He was seen in consultation by cardiology who recommended cardiac catheterization. Cardiac catheterization was performed and revealed two-vessel disease. Post catheterization, the patient developed acute renal failure. He was seen by nephrology who recommended conservative therapy. He was subsequently switched from IV Lasix to oral Lasix. His renal function improved post hydration. He  was weaned off oxygen. In regards to his heart disease, cardiology recommended medical management for the time being with follow-up outpatient Myoview for potential coronary artery bypass graft in the future. The patient was transferred out of the Intensive Care Unit to the telemetry floor where he remained stable. By 11/28/2011, the patient was stable and ready for discharge.   DISCHARGE DIAGNOSES:  1. Acute myocardial infarction.  2. Two-vessel coronary artery disease.  3. Acute systolic congestive heart failure.  4. Acute renal failure, resolved.  5. Type 2 diabetes mellitus.  6. Benign hypertension.  7. Gastroesophageal reflux disease.   DISCHARGE MEDICATIONS:  1. Atenolol 50 mg p.o. daily.  2. Folic acid 1 mg p.o. daily.  3. Amlodipine 10 mg p.o. daily.  4. Omeprazole 20 mg p.o. daily.  5. Glyburide 5 mg p.o. daily.  6. Januvia 50 mg p.o. daily.  7. Losartan 100 mg p.o. daily.  8. Ambien 5 mg p.o. at bedtime.  9. Advair 250/50 one puff twice a day.  10. Mucinex 600 mg p.o. b.i.d.  11. Lipitor 10 mg p.o. at bedtime.  12. Enteric-coated aspirin 81 mg p.o. daily.          FOLLOW-UP PLANS AND APPOINTMENTS:  1. The patient was discharged on a low sodium ADA diet.  2. He will follow up with me in the office within one week's time period, sooner if needed.   ____________________________ Leonie Douglas Doy Hutching, MD jds:ap D: 12/06/2011 00:34:33 ET T: 12/06/2011 14:14:33 ET JOB#: [id number]  cc: Leonie Douglas. Doy Hutching, MD, <Dictator> Dewanda Fennema Lennice Sites MD ELECTRONICALLY SIGNED 12/07/2011 0:47

## 2015-02-04 NOTE — Consult Note (Signed)
PATIENT NAME:  Darrell Dickerson, Darrell Dickerson MR#:  [id number] DATE OF BIRTH:  May 30, 1931  DATE OF CONSULTATION:  11/27/2011  REFERRING PHYSICIAN:  Dr. Fulton Reek  CONSULTING PHYSICIAN:  Yacob Wilkerson Lilian Kapur, MD  REASON FOR CONSULTATION: Acute renal failure in the setting of chronic kidney disease, stage III.   HISTORY OF PRESENT ILLNESS: The patient is a very pleasant 81 year old Caucasian male with past medical history of hypertension, diabetes mellitus type 2, gastroesophageal reflux disease, underlying coronary artery disease found on this admission, chronic kidney disease, stage III, baseline EGFR of 39 who presented to [facility] with complaints of shortness of breath. Patient reports that he had been having shortness of breath 5 to 6 days at home prior to admission. He felt that he had an upper respiratory infection. However, his shortness of breath progressed and he developed significant orthopnea. He also had dyspnea with exertion. He came to [facility] for evaluation of this and was found to have acute systolic heart failure. His hospital course has also been complicated by an acute myocardial infarction. He was taken for cardiac catheterization on 11/24/2011. Ventriculography was carried out at that time which showed an ejection fraction of 47%. There was also severe two vessel coronary artery disease including complex diffuse mid left anterior descending stenosis which was not amenable to PCI. As part of treatment the patient had been receiving IV diuretics. We are now consulted for the evaluation and management of acute renal failure in the setting of known chronic kidney disease, stage III. As above his baseline EGFR is 39 from 08/2011. His creatinine is currently 3.08. It peaked at 3.62. No urinalysis is available currently for review. Patient is currently receiving IV fluid hydration with normal saline at 75 mL/h. Urine output over the past 24 hours was found to  be 3 liters.   PAST MEDICAL HISTORY:  1. Hypertension.  2. Diabetes mellitus.  3. Gastroesophageal reflux disease.  4. Two-vessel coronary artery disease which is severe in nature per cardiac catheterization 11/24/2011.  5. Chronic kidney disease, stage III, baseline EGFR of 39 from 08/2011.   PAST SURGICAL HISTORY:  1. Cataract surgery.  2. Cardiac catheterization 11/24/2011.   ALLERGIES: No known drug allergies.   CURRENT INPATIENT MEDICATIONS:  1. Normal saline 75 mL/h. 2. Tylenol 650 mg p.o. every four hours p.r.n.  3. Amlodipine 10 mg daily.  4. Atenolol 50 mg daily. 5. Folic acid 1 mg daily. 6. Glyburide 5 mg p.o. daily.  7. Hydrochlorothiazide 12.5 mg p.o. daily.  8. Sliding scale insulin.  9. Losartan 100 mg daily. 10. Metformin 1000 mg p.o. b.i.d., which is currently on hold. 11. Omeprazole 20 mg p.o. every 6:00 a.m.  12. Januvia 50 mg daily, which is currently on hold. 13. Ambien 5 mg p.o. at bedtime. 14. Furosemide 40 mg IV every 12 hours, which is currently on hold.  15. Potassium chloride 40 mEq p.o. b.i.d., which is currently on hold.  16. Mucinex 600 mg p.o. b.i.d.  17. Advair 250/50, 1 puff inhaled b.i.d.  18. Heparin 5000 units subcutaneous every eight hours.   SOCIAL HISTORY: Patient lives in [place]. He is married. He has two adult children. 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 There is a remote history of tobacco abuse but he quit some time ago. Drinks alcohol occasionally.   FAMILY HISTORY: Father died secondary to myocardial infarction. He is unclear as to how his mother died but thinks that she may  have died from strangulated hernia.   REVIEW OF SYSTEMS: CONSTITUTIONAL: Patient denies fevers, chills, weight loss. EYES: Denies diplopia, blurry vision. Had history of cataracts. HENT: Denies headaches, hearing loss. Denies epistaxis. PULMONARY: Denies cough. Has some mild wheezing as well as some shortness of breath. CARDIOVASCULAR:  Currently denies chest pains or palpitations does have some orthopnea. GASTROINTESTINAL: Denies nausea, vomiting, diarrhea, or bloody stools. GENITOURINARY: Denies frequency, urgency, dysuria. NEUROLOGIC: Denies focal extremity numbness, weakness, or tingling. MUSCULOSKELETAL: Denies joint pain, swelling, or redness. INTEGUMENTARY: Denies skin rashes, lesions. ENDOCRINE: Denies polyuria, polydipsia, or polyphagia. HEMATOLOGIC/LYMPHATIC: Denies easy bruisability, bleeding, or swollen lymph nodes. PSYCHIATRIC: Denies depression, bipolar disorder. ALLERGY/IMMUNOLOGIC: Denies seasonal allergies or history of immunodeficiency.   PHYSICAL EXAMINATION:  VITAL SIGNS: Temperature 98.1, pulse 70, blood pressure 97/35, pulse oximetry 95% 2 liters.   GENERAL: Well-developed, well-nourished Caucasian male who appears his stated age, currently no acute distress.   HEENT: Normocephalic, atraumatic. Extraocular movements are intact. Pupils are equal, round, reactive to light. No scleral icterus. Conjunctivae are pink. No epistaxis noted. Gross hearing intact. Oral mucosa moist.   NECK: Supple without JVD or lymphadenopathy.   LUNGS: Lungs demonstrate mild bilateral wheezing and basilar rales.   CARDIOVASCULAR: S1, S2 regular rate and rhythm. No murmurs or rubs appreciated.   ABDOMEN: Obese, soft, nontender, nondistended. Bowel sounds positive. No rebound or guarding. No gross organomegaly appreciated.   EXTREMITIES: No clubbing or cyanosis. 1+ bilateral pitting edema noted in the lower extremities.   NEUROLOGICAL: Patient is alert and oriented to time, person, and place. Strength is 5/5 in both upper and lower extremities.   GENITOURINARY: No suprapubic tenderness noted at this time.   MUSCULOSKELETAL: No joint redness, swelling or tenderness appreciated.   SKIN: Warm and dry. No rashes noted.   PSYCHIATRIC: Patient with appropriate affect and appears to have good insight into his current illness.    LABORATORY, DIAGNOSTIC AND RADIOLOGICAL DATA: Chest x-ray shows findings suggestive of low-grade congestive heart failure. CBC shows WBC 7.2, hemoglobin 10, hematocrit 29, platelets 178. BMP shows sodium 140, potassium 4.5, chloride 105, CO2 23, BUN 51, creatinine 3.08, glucose 109. BNP was 21,617 upon admission.   IMPRESSION: This is a 81 year old Caucasian male with past medical history of hypertension, diabetes mellitus type 2, gastroesophageal reflux disease, two vessel severe coronary artery disease, chronic kidney disease stage III, baseline EGFR of 39 who presented to [facility] with acute systolic heart failure, and also found to have acute myocardial infarction.  1. Acute renal failure. This appears to be multifactorial. Certainly IV contrast contributed to this. In addition, the patient was noted to be on losartan/hydrochlorothiazide as well as a diuretic therapy. At present we will hold losartan. Lasix is also currently being held. We will also hold hydrochlorothiazide at this point in time. We will check renal ultrasound as well as urinalysis, urine protein to creatinine ratio, SPEP, UPEP, and ANA. As the creatinine is currently coming down and urine output is excellent there is no acute indication for dialysis at this time; however, we will continue to monitor renal function and urine output closely.  2. Chronic kidney disease, stage III. I have discussed the case with Dr. Doy Hutching. I appreciate laboratory data provided to [place]. It appears that his baseline EGFR from November 2012 was 39. He certainly has risk factors for underlying chronic kidney disease including hypertension, diabetes mellitus, and obesity. As above, we are checking SPEP and UPEP as well as a renal ultrasound. We will also quantify  his proteinuria if he has any. As above we are holding ARB at this point in time.  3. Acute systolic heart failure. It appears that the patient has been diuresed effectively.  Agree with stopping diuretic therapy at this point in time.  4. Anemia, not otherwise specified. Patient's hemoglobin has drifted down over the hospitalization. Hemoglobin currently is 10. We will check SPEP, UPEP, ferritin, and iron studies.   I would like to thank Dr. Doy Hutching for this kind referral. Further plan as patient progresses.  ____________________________ Tama High, MD mnl:cms D: 11/27/2011 10:19:17 ET T: 11/27/2011 10:53:50 ET JOB#: [id number]  cc: Tama High, MD, <Dictator> Tama High MD ELECTRONICALLY SIGNED 12/28/2011 23:57

## 2015-02-04 NOTE — Consult Note (Signed)
PATIENT NAME:  Darrell Dickerson, Darrell Dickerson MR#:  [id number] DATE OF BIRTH:  [birth year]  DATE OF CONSULTATION:  11/23/2011  REFERRING/PRIMARY CARE PHYSICIAN:  Fulton Reek, MD   CONSULTING PHYSICIAN:  Isaias Cowman, MD  CHIEF COMPLAINT: Shortness of breath.   HISTORY OF PRESENT ILLNESS: The patient is a 81 year old gentleman referred for evaluation of congestive heart failure and elevated troponin. The patient has a history of hypertension, diabetes. The patient reports he has had a one-week history of mildly productive cough. The patient has noted increasing shortness of breath 24 hours prior to admission. Three hours prior to admission, the patient had significant increase of shortness of breath and was brought to [facility] Emergency Room where he was felt to be in congestive heart failure. Chest x-ray revealed acute pulmonary interstitial and alveolar edema. The patient had elevated troponin of 0.91 as well as elevated CPK-MB of 4.7. The patient denies chest pain. There were no acute ischemic ST-T wave changes. The patient reports feeling better today after diuresis.   PAST MEDICAL HISTORY:  1. Hypertension.  2. Type 2 diabetes.  3. Gastroesophageal reflux disease.   MEDICATIONS:  1. Amlodipine 10 mg daily.  2. Losartan/HCTZ 100/25 mg daily.  3. Atenolol 50 mg daily.  4. Hydrochlorothiazide 1 daily.  5. Actos 30 mg daily.  6. Glyburide 12.5 mg b.i.d.  7. Janumet 50/1000 b.i.d.  8. Omeprazole 20 mg, 2 daily.  9. Folic acid 1 mg daily.   SOCIAL HISTORY: The patient is a retired Hotel manager. He is married, lives with his wife. He has a remote tobacco abuse history.   FAMILY HISTORY: Father died with known coronary artery disease at age 64.   REVIEW OF SYSTEMS: CONSTITUTIONAL: The patient denies fever or chills. EYES: No blurry vision. EARS: No hearing loss. CARDIOVASCULAR: No chest pain. RESPIRATORY: The patient does have a several-day history of increasing shortness of  breath with acute increase in the three hours prior to admission. GASTROINTESTINAL: The patient denies nausea, vomiting, diarrhea, or constipation. GU: The patient denies dysuria or hematuria. ENDOCRINE: The patient denies polyuria or polydipsia. MUSCULOSKELETAL: The patient denies arthralgias, myalgias. NEUROLOGICAL: The patient denies any focal muscle weakness or numbness. PSYCHOLOGICAL: The patient denies depression or anxiety.   PHYSICAL EXAMINATION:  VITAL SIGNS: Blood pressure 150/74, pulse 68, respirations 27, pulse oximetry 91%, temperature 98.1.   HEENT: Pupils are equal and reactive to light and accommodation.   NECK: Supple without thyromegaly.   LUNGS: Decreased breath sounds in both bases.   HEART: Normal jugular venous pressure. Normal point of maximal impulse. Regular rate and rhythm. Normal S1, S2. No appreciable gallop, murmur, or rub.   ABDOMEN: Soft and nontender. Pulses were intact bilaterally. There was 1+ bilateral pedal edema.   MUSCULOSKELETAL: Normal muscle tone.   NEUROLOGICAL: The patient was alert and oriented x3. Motor and sensory are both grossly intact.   IMPRESSION: A 81 year old gentleman who presents with new onset congestive heart failure with elevated troponin and CPK-MB.   RECOMMENDATIONS:  1. I agree with current therapy.  2. I would defer full-dose anticoagulation at this time in the absence of chest pain.  3. I agree with continued diuresis with intravenous furosemide.  4. Review 2-D echocardiogram.  5. Proceed with right and left heart cardiac catheterization with selective coronary arteriography in the a.m. The risks, benefits, and alternatives were explained, and informed written consent was obtained.   ____________________________ Isaias Cowman, MD ap:cbb D: 11/23/2011 10:39:22 ET T: 11/23/2011 10:48:00 ET JOB#: [id number]  cc: Isaias Cowman, MD, <Dictator> Isaias Cowman MD ELECTRONICALLY SIGNED 11/28/2011 10:57

## 2015-10-31 DIAGNOSIS — E1121 Type 2 diabetes mellitus with diabetic nephropathy: Secondary | ICD-10-CM | POA: Insufficient documentation

## 2016-04-03 DIAGNOSIS — I878 Other specified disorders of veins: Secondary | ICD-10-CM | POA: Insufficient documentation

## 2016-10-20 ENCOUNTER — Encounter: Payer: Medicare Other | Attending: Internal Medicine | Admitting: Dietician

## 2016-10-20 ENCOUNTER — Encounter: Payer: Self-pay | Admitting: Dietician

## 2016-10-20 VITALS — Ht 68.0 in | Wt 184.6 lb

## 2016-10-20 DIAGNOSIS — N183 Chronic kidney disease, stage 3 unspecified: Secondary | ICD-10-CM

## 2016-10-20 DIAGNOSIS — E1122 Type 2 diabetes mellitus with diabetic chronic kidney disease: Secondary | ICD-10-CM

## 2016-10-20 DIAGNOSIS — E119 Type 2 diabetes mellitus without complications: Secondary | ICD-10-CM | POA: Insufficient documentation

## 2016-10-20 DIAGNOSIS — Z6828 Body mass index (BMI) 28.0-28.9, adult: Secondary | ICD-10-CM | POA: Diagnosis not present

## 2016-10-20 DIAGNOSIS — Z713 Dietary counseling and surveillance: Secondary | ICD-10-CM | POA: Diagnosis not present

## 2016-10-20 NOTE — Patient Instructions (Signed)
   Keep portions of starchy foods controlled to 1 cup (fist-size) or less. If less, add a serving of fruit.   Make sure to include a protein food with every meal such as lean meat, egg, lowfat cheese, nuts, peanut butter, or beans. Beans also have starch.   Eat a meal or snack every 3-5 hours.   Have a bedtime snack with some carb and protein and see if it improves morning blood sugars.

## 2016-10-20 NOTE — Progress Notes (Signed)
Medical Nutrition Therapy: Visit start time: 0930  end time: [id number]  Assessment:  Diagnosis: Type 2 Diabetes,  Past medical history: Stage 3 CKD, HTN, HLD, CHF Psychosocial issues/ stress concerns: none Preferred learning method:  . No preference indicated  Current weight: 184.6lbs  Height: 5'8" Medications, supplements: reviewed list in medical record with patient  Progress and evaluation: Patient reports declining BG control over the past several months. Recent HbA1C of 9.8%per MD notes. He has been working to decrease carbohydrate intake and lose weight in order to prevent need for insulin therapy.    Physical activity: walking 30 minutes, 2 times a week (was walking more often until recently); golf 2-3 times a week in warmer weather.   Dietary Intake:  Usual eating pattern includes 3 meals and 2 snacks per day. Dining out frequency: 6 meals per week.  Breakfast: oatmeal with fruit, or Cheerios, or eggs with sausage and 1 piece toast Snack: toasted nuts or fruit Lunch: sandwich with lettuce, lowfat fat bologna or chicken salad, pimento cheese, or salad from [employer]  Snack: toasted nuts or fruit Supper: boiled or broiled chicken or fish, or burger patty with vegetables sometimes small order fries or small baked potato. Avoids bread when eating burgers.  Snack: none Beverages: water  Nutrition Care Education: Topics covered: diabetes, kidney disease, HTN, HLD Basic nutrition: basic food groups, appropriate nutrient balance, appropriate meal and snack schedule Weight control: determining reasonable weight goal, appropriate food portions Diabetes:  appropriate meal and snack schedule, appropriate carb intake and balance, role of protein and fiber  Hypertension: identifying high sodium foods Hyperlipidemia: healthy and unhealthy fats Kidney disease:  Importance of limiting sodium intake and controlling portions of meats; monitoring blood potassium levels.   Nutritional Diagnosis:   [place]-2.2 Altered nutrition-related laboratory As related to hyperglycemia.  As evidenced by lab report, patient report.  Intervention: Instruction as noted above.   Worked primarily to achieve consistent carbohydrate intake and balanced nutrition in meals and snacks.    Set goals with patient input.   Education Materials given:  . Plate Planner . Food lists/ Planning A Balanced Meal . Sample meal pattern/ menus . Snacking handout . Goals/ instructions  Learner/ who was taught:  . Patient  . Spouse/ partner  Level of understanding: . Partial understanding; needs review/ practice  Demonstrated degree of understanding via:   Teach back Learning barriers: . None  Willingness to learn/ readiness for change: . Eager, change in progress  Monitoring and Evaluation:  Dietary intake, exercise, BG control, and body weight      follow up: 11/10/16

## 2016-11-10 ENCOUNTER — Encounter: Payer: Medicare Other | Admitting: Dietician

## 2016-11-10 ENCOUNTER — Encounter: Payer: Self-pay | Admitting: Dietician

## 2016-11-10 VITALS — Ht 68.0 in | Wt 184.8 lb

## 2016-11-10 DIAGNOSIS — E119 Type 2 diabetes mellitus without complications: Secondary | ICD-10-CM | POA: Diagnosis not present

## 2016-11-10 DIAGNOSIS — N183 Chronic kidney disease, stage 3 (moderate): Principal | ICD-10-CM

## 2016-11-10 DIAGNOSIS — E1122 Type 2 diabetes mellitus with diabetic chronic kidney disease: Secondary | ICD-10-CM

## 2016-11-10 NOTE — Patient Instructions (Signed)
   Try a bedtime snack for several days and see if it improves morning blood sugars.   Keep up your good portion control and regular exercise, you are doing a great job!  Pain, discomfort, inadequate sleep or restless sleep, and stress can all make morning sugars higher.

## 2016-11-10 NOTE — Progress Notes (Signed)
Medical Nutrition Therapy: Visit start time: 0930  end time: 1015  Assessment:  Diagnosis: diabetes Medical history changes: no changes  Psychosocial issues/ stress concerns: none  Current weight: 184.8lbs with shoes  Height: 5'8" Medications, supplement changes: no changes per patient  Progress and evaluation: Weight stable since previous visit on 10/20/16. Patient brought 4-day food diary with BG readings. He recorded fasting BGs of 126, 202, 145, 165, and 126. He will test post-meal readings for 2 weeks prior to next MD visit. He voices concern over 202 reading, wants to know what could cause elevated readings so he can prevent future elevated readings.    Physical activity: walking indoors at mall 4-5 times weekly, + golf up to 2-3 times a week when weather permits.   Dietary Intake:  Usual eating pattern includes 3 meals and 1-2 snacks per day. Dining out frequency: not assessed today.  Breakfast: oatmeal with fruit, or eggs, toast, and [place] sausage, coffee Snack: crackers with cheese or peanut butter, nuts, fruit Lunch: 1/2 sandwich, or crackers with cheese Snack: same as am Supper: 5:30pm soup and crackers, chicken and rice, other meat and vegetables. Snack: none Bedtime is 10-11pm Beverages: coffee, water, flavored water (sugar free)  Nutrition Care Education: Topics covered: Diabetes Basic nutrition: reviewed appropriate nutrient balance, appropriate meal and snack schedule  Advanced nutrition: food label reading for carbohydrate Diabetes: reviewed appropriate carb intake and balance, factors affecting fasting BGs, influence of supper meals and evening snacks on fasting BGs.   Nutritional Diagnosis:  [place]-2.2 Altered nutrition-related laboratory As related to diabetes.  As evidenced by patient report.  Intervention: Discussion as noted above.    Encouraged patient to try bedtime snack for several days and monitor effect on fasting BGs, as he is awake several hours between  supper and his bedtime.    Patient declined additional follow-up at this time; he will schedule later if needed.   Education Materials given:  Marland Kitchen Snacking handout . Goals/ instructions  Learner/ who was taught:  . Patient  . Spouse/ partner  Level of understanding: Marland Kitchen Verbalizes/ demonstrates competency  Demonstrated degree of understanding via:   Teach back Learning barriers: . None  Willingness to learn/ readiness for change: . Eager, change in progress  Monitoring and Evaluation:  Dietary intake, exercise, BG control, and body weight      follow up: prn

## 2019-04-20 DIAGNOSIS — E1122 Type 2 diabetes mellitus with diabetic chronic kidney disease: Secondary | ICD-10-CM | POA: Diagnosis not present

## 2019-04-20 DIAGNOSIS — Z79899 Other long term (current) drug therapy: Secondary | ICD-10-CM | POA: Diagnosis not present

## 2019-04-20 DIAGNOSIS — N183 Chronic kidney disease, stage 3 (moderate): Secondary | ICD-10-CM | POA: Diagnosis not present

## 2019-04-22 DIAGNOSIS — E1169 Type 2 diabetes mellitus with other specified complication: Secondary | ICD-10-CM | POA: Diagnosis not present

## 2019-04-22 DIAGNOSIS — Z9861 Coronary angioplasty status: Secondary | ICD-10-CM | POA: Diagnosis not present

## 2019-04-22 DIAGNOSIS — I5022 Chronic systolic (congestive) heart failure: Secondary | ICD-10-CM | POA: Diagnosis not present

## 2019-04-22 DIAGNOSIS — I2510 Atherosclerotic heart disease of native coronary artery without angina pectoris: Secondary | ICD-10-CM | POA: Diagnosis not present

## 2019-04-22 DIAGNOSIS — E1159 Type 2 diabetes mellitus with other circulatory complications: Secondary | ICD-10-CM | POA: Diagnosis not present

## 2019-04-22 DIAGNOSIS — Z0000 Encounter for general adult medical examination without abnormal findings: Secondary | ICD-10-CM | POA: Diagnosis not present

## 2019-04-22 DIAGNOSIS — Z79899 Other long term (current) drug therapy: Secondary | ICD-10-CM | POA: Diagnosis not present

## 2019-04-22 DIAGNOSIS — N183 Chronic kidney disease, stage 3 (moderate): Secondary | ICD-10-CM | POA: Diagnosis not present

## 2019-04-22 DIAGNOSIS — E1122 Type 2 diabetes mellitus with diabetic chronic kidney disease: Secondary | ICD-10-CM | POA: Diagnosis not present

## 2019-04-25 DIAGNOSIS — I2510 Atherosclerotic heart disease of native coronary artery without angina pectoris: Secondary | ICD-10-CM | POA: Diagnosis not present

## 2019-04-25 DIAGNOSIS — E1122 Type 2 diabetes mellitus with diabetic chronic kidney disease: Secondary | ICD-10-CM | POA: Diagnosis not present

## 2019-04-25 DIAGNOSIS — I10 Essential (primary) hypertension: Secondary | ICD-10-CM | POA: Diagnosis not present

## 2019-04-25 DIAGNOSIS — E1169 Type 2 diabetes mellitus with other specified complication: Secondary | ICD-10-CM | POA: Diagnosis not present

## 2019-04-25 DIAGNOSIS — N183 Chronic kidney disease, stage 3 (moderate): Secondary | ICD-10-CM | POA: Diagnosis not present

## 2019-04-25 DIAGNOSIS — Z9861 Coronary angioplasty status: Secondary | ICD-10-CM | POA: Diagnosis not present

## 2019-04-25 DIAGNOSIS — I5022 Chronic systolic (congestive) heart failure: Secondary | ICD-10-CM | POA: Diagnosis not present

## 2019-04-25 DIAGNOSIS — E1159 Type 2 diabetes mellitus with other circulatory complications: Secondary | ICD-10-CM | POA: Diagnosis not present

## 2019-04-25 DIAGNOSIS — E785 Hyperlipidemia, unspecified: Secondary | ICD-10-CM | POA: Diagnosis not present

## 2019-04-27 DIAGNOSIS — E1122 Type 2 diabetes mellitus with diabetic chronic kidney disease: Secondary | ICD-10-CM | POA: Diagnosis not present

## 2019-04-27 DIAGNOSIS — E1169 Type 2 diabetes mellitus with other specified complication: Secondary | ICD-10-CM | POA: Diagnosis not present

## 2019-04-27 DIAGNOSIS — E785 Hyperlipidemia, unspecified: Secondary | ICD-10-CM | POA: Diagnosis not present

## 2019-04-27 DIAGNOSIS — E1159 Type 2 diabetes mellitus with other circulatory complications: Secondary | ICD-10-CM | POA: Diagnosis not present

## 2019-04-27 DIAGNOSIS — N183 Chronic kidney disease, stage 3 (moderate): Secondary | ICD-10-CM | POA: Diagnosis not present

## 2019-04-27 DIAGNOSIS — I10 Essential (primary) hypertension: Secondary | ICD-10-CM | POA: Diagnosis not present

## 2019-08-12 DIAGNOSIS — H2511 Age-related nuclear cataract, right eye: Secondary | ICD-10-CM | POA: Diagnosis not present

## 2019-08-18 DIAGNOSIS — N183 Chronic kidney disease, stage 3 unspecified: Secondary | ICD-10-CM | POA: Diagnosis not present

## 2019-08-18 DIAGNOSIS — E1169 Type 2 diabetes mellitus with other specified complication: Secondary | ICD-10-CM | POA: Diagnosis not present

## 2019-08-18 DIAGNOSIS — Z79899 Other long term (current) drug therapy: Secondary | ICD-10-CM | POA: Diagnosis not present

## 2019-08-18 DIAGNOSIS — E1122 Type 2 diabetes mellitus with diabetic chronic kidney disease: Secondary | ICD-10-CM | POA: Diagnosis not present

## 2019-08-18 DIAGNOSIS — E785 Hyperlipidemia, unspecified: Secondary | ICD-10-CM | POA: Diagnosis not present

## 2019-08-25 DIAGNOSIS — E1122 Type 2 diabetes mellitus with diabetic chronic kidney disease: Secondary | ICD-10-CM | POA: Diagnosis not present

## 2019-08-25 DIAGNOSIS — Z23 Encounter for immunization: Secondary | ICD-10-CM | POA: Diagnosis not present

## 2019-08-25 DIAGNOSIS — N1832 Chronic kidney disease, stage 3b: Secondary | ICD-10-CM | POA: Diagnosis not present

## 2019-08-25 DIAGNOSIS — E785 Hyperlipidemia, unspecified: Secondary | ICD-10-CM | POA: Diagnosis not present

## 2019-08-25 DIAGNOSIS — I10 Essential (primary) hypertension: Secondary | ICD-10-CM | POA: Diagnosis not present

## 2019-08-25 DIAGNOSIS — Z79899 Other long term (current) drug therapy: Secondary | ICD-10-CM | POA: Diagnosis not present

## 2019-08-25 DIAGNOSIS — E1169 Type 2 diabetes mellitus with other specified complication: Secondary | ICD-10-CM | POA: Diagnosis not present

## 2019-08-25 DIAGNOSIS — Z87891 Personal history of nicotine dependence: Secondary | ICD-10-CM | POA: Diagnosis not present

## 2019-08-25 DIAGNOSIS — E1159 Type 2 diabetes mellitus with other circulatory complications: Secondary | ICD-10-CM | POA: Diagnosis not present

## 2019-08-26 DIAGNOSIS — E1159 Type 2 diabetes mellitus with other circulatory complications: Secondary | ICD-10-CM | POA: Diagnosis not present

## 2019-08-26 DIAGNOSIS — H2511 Age-related nuclear cataract, right eye: Secondary | ICD-10-CM | POA: Diagnosis not present

## 2019-09-06 ENCOUNTER — Encounter: Payer: Self-pay | Admitting: *Deleted

## 2019-09-06 ENCOUNTER — Other Ambulatory Visit: Payer: Self-pay

## 2019-09-12 ENCOUNTER — Other Ambulatory Visit
Admission: RE | Admit: 2019-09-12 | Discharge: 2019-09-12 | Disposition: A | Discharge status: Home / Self Care | Payer: Medicare HMO | Source: Ambulatory Visit | Attending: Ophthalmology | Admitting: Ophthalmology

## 2019-09-12 DIAGNOSIS — Z20828 Contact with and (suspected) exposure to other viral communicable diseases: Secondary | ICD-10-CM | POA: Insufficient documentation

## 2019-09-12 DIAGNOSIS — Z01812 Encounter for preprocedural laboratory examination: Secondary | ICD-10-CM | POA: Insufficient documentation

## 2019-09-13 LAB — SARS CORONAVIRUS 2 (TAT 6-24 HRS): SARS Coronavirus 2: NEGATIVE

## 2019-09-13 NOTE — Discharge Instructions (Signed)

## 2019-09-14 ENCOUNTER — Ambulatory Visit
Admission: RE | Admit: 2019-09-14 | Discharge: 2019-09-14 | Disposition: A | Discharge status: Home / Self Care | Payer: Medicare HMO | Attending: Ophthalmology | Admitting: Ophthalmology

## 2019-09-14 ENCOUNTER — Ambulatory Visit: Payer: Medicare HMO | Admitting: Anesthesiology

## 2019-09-14 ENCOUNTER — Other Ambulatory Visit: Payer: Self-pay

## 2019-09-14 ENCOUNTER — Encounter
Admission: RE | Disposition: A | Discharge status: Home / Self Care | Payer: Self-pay | Source: Home / Self Care | Attending: Ophthalmology

## 2019-09-14 DIAGNOSIS — E1122 Type 2 diabetes mellitus with diabetic chronic kidney disease: Secondary | ICD-10-CM | POA: Insufficient documentation

## 2019-09-14 DIAGNOSIS — Z7984 Long term (current) use of oral hypoglycemic drugs: Secondary | ICD-10-CM | POA: Diagnosis not present

## 2019-09-14 DIAGNOSIS — K219 Gastro-esophageal reflux disease without esophagitis: Secondary | ICD-10-CM | POA: Insufficient documentation

## 2019-09-14 DIAGNOSIS — I509 Heart failure, unspecified: Secondary | ICD-10-CM | POA: Diagnosis not present

## 2019-09-14 DIAGNOSIS — I130 Hypertensive heart and chronic kidney disease with heart failure and stage 1 through stage 4 chronic kidney disease, or unspecified chronic kidney disease: Secondary | ICD-10-CM | POA: Diagnosis not present

## 2019-09-14 DIAGNOSIS — E7800 Pure hypercholesterolemia, unspecified: Secondary | ICD-10-CM | POA: Diagnosis not present

## 2019-09-14 DIAGNOSIS — H2511 Age-related nuclear cataract, right eye: Secondary | ICD-10-CM | POA: Diagnosis not present

## 2019-09-14 DIAGNOSIS — E1136 Type 2 diabetes mellitus with diabetic cataract: Secondary | ICD-10-CM | POA: Diagnosis not present

## 2019-09-14 DIAGNOSIS — Z79899 Other long term (current) drug therapy: Secondary | ICD-10-CM | POA: Insufficient documentation

## 2019-09-14 DIAGNOSIS — H25811 Combined forms of age-related cataract, right eye: Secondary | ICD-10-CM | POA: Diagnosis not present

## 2019-09-14 DIAGNOSIS — N183 Chronic kidney disease, stage 3 unspecified: Secondary | ICD-10-CM | POA: Insufficient documentation

## 2019-09-14 DIAGNOSIS — Z87891 Personal history of nicotine dependence: Secondary | ICD-10-CM | POA: Insufficient documentation

## 2019-09-14 HISTORY — DX: Essential (primary) hypertension: I10

## 2019-09-14 HISTORY — DX: Gastro-esophageal reflux disease without esophagitis: K21.9

## 2019-09-14 HISTORY — PX: CATARACT EXTRACTION W/PHACO: SHX586

## 2019-09-14 HISTORY — DX: Hyperlipidemia, unspecified: E78.5

## 2019-09-14 HISTORY — DX: Type 2 diabetes mellitus without complications: E11.9

## 2019-09-14 HISTORY — DX: Atherosclerotic heart disease of native coronary artery without angina pectoris: I25.10

## 2019-09-14 HISTORY — DX: Chronic kidney disease, stage 3 unspecified: N18.30

## 2019-09-14 LAB — GLUCOSE, CAPILLARY
Glucose-Capillary: 167 mg/dL — ABNORMAL HIGH (ref 70–99)
Glucose-Capillary: 155 mg/dL — ABNORMAL HIGH (ref 70–99)

## 2019-09-14 SURGERY — PHACOEMULSIFICATION, CATARACT, WITH IOL INSERTION
Anesthesia: Monitor Anesthesia Care | Site: Eye | Laterality: Right

## 2019-09-14 MED ORDER — TETRACAINE HCL 0.5 % OP SOLN
1.0000 [drp] | OPHTHALMIC | Status: DC | PRN
  Administered 2019-09-14 (×3): 1 [drp] via OPHTHALMIC

## 2019-09-14 MED ORDER — NA HYALUR & NA CHOND-NA HYALUR 0.4-0.35 ML IO KIT
PACK | INTRAOCULAR | Status: DC | PRN
  Administered 2019-09-14: 1 mL via INTRAOCULAR

## 2019-09-14 MED ORDER — CEFUROXIME OPHTHALMIC INJECTION 1 MG/0.1 ML
INJECTION | OPHTHALMIC | Status: DC | PRN
  Administered 2019-09-14: 0.1 mL via INTRACAMERAL

## 2019-09-14 MED ORDER — ONDANSETRON HCL 4 MG/2ML IJ SOLN: 4.0000 mg | Freq: Once | INTRAMUSCULAR | Status: DC | PRN

## 2019-09-14 MED ORDER — BRIMONIDINE TARTRATE-TIMOLOL 0.2-0.5 % OP SOLN
OPHTHALMIC | Status: DC | PRN
  Administered 2019-09-14: 1 [drp] via OPHTHALMIC

## 2019-09-14 MED ORDER — LACTATED RINGERS IV SOLN: INTRAVENOUS | Status: DC

## 2019-09-14 MED ORDER — EPINEPHRINE PF 1 MG/ML IJ SOLN
INTRAOCULAR | Status: DC | PRN
  Administered 2019-09-14: 74 mL via OPHTHALMIC

## 2019-09-14 MED ORDER — ARMC OPHTHALMIC DILATING DROPS
1.0000 "application " | OPHTHALMIC | Status: DC | PRN
  Administered 2019-09-14 (×3): 1 via OPHTHALMIC

## 2019-09-14 MED ORDER — MOXIFLOXACIN HCL 0.5 % OP SOLN
1.0000 [drp] | OPHTHALMIC | Status: DC | PRN
  Administered 2019-09-14 (×3): 1 [drp] via OPHTHALMIC

## 2019-09-14 MED ORDER — FENTANYL CITRATE (PF) 100 MCG/2ML IJ SOLN
INTRAMUSCULAR | Status: DC | PRN
  Administered 2019-09-14: 50 ug via INTRAVENOUS

## 2019-09-14 MED ORDER — LIDOCAINE HCL (PF) 2 % IJ SOLN
INTRAOCULAR | Status: DC | PRN
  Administered 2019-09-14: 2 mL

## 2019-09-14 MED ORDER — MIDAZOLAM HCL 2 MG/2ML IJ SOLN
INTRAMUSCULAR | Status: DC | PRN
  Administered 2019-09-14: 1 mg via INTRAVENOUS

## 2019-09-14 SURGICAL SUPPLY — 20 items
CANNULA ANT/CHMB 27G (MISCELLANEOUS) ×1 IMPLANT
CANNULA ANT/CHMB 27GA (MISCELLANEOUS) ×3 IMPLANT
GLOVE SURG LX 7.5 STRW (GLOVE) ×2
GLOVE SURG LX STRL 7.5 STRW (GLOVE) ×1 IMPLANT
GLOVE SURG TRIUMPH 8.0 PF LTX (GLOVE) ×3 IMPLANT
GOWN STRL REUS W/ TWL LRG LVL3 (GOWN DISPOSABLE) ×2 IMPLANT
GOWN STRL REUS W/TWL LRG LVL3 (GOWN DISPOSABLE) ×4
LENS IOL ACRSF IQ ULTRA 25.5 (Intraocular Lens) IMPLANT
LENS IOL ACRYSOF IQ 25.5 (Intraocular Lens) ×3 IMPLANT
MARKER SKIN DUAL TIP RULER LAB (MISCELLANEOUS) ×3 IMPLANT
NDL FILTER BLUNT 18X1 1/2 (NEEDLE) IMPLANT
NEEDLE FILTER BLUNT 18X 1/2SAF (NEEDLE) ×2
NEEDLE FILTER BLUNT 18X1 1/2 (NEEDLE) ×1 IMPLANT
PACK CATARACT BRASINGTON (MISCELLANEOUS) ×3 IMPLANT
PACK EYE AFTER SURG (MISCELLANEOUS) ×3 IMPLANT
PACK OPTHALMIC (MISCELLANEOUS) ×3 IMPLANT
SYR 3ML LL SCALE MARK (SYRINGE) ×3 IMPLANT
SYR TB 1ML LUER SLIP (SYRINGE) ×3 IMPLANT
WATER STERILE IRR 500ML POUR (IV SOLUTION) ×3 IMPLANT
WIPE NON LINTING 3.25X3.25 (MISCELLANEOUS) ×3 IMPLANT

## 2019-09-14 NOTE — Anesthesia Procedure Notes (Signed)
Procedure Name: MAC Performed by: Izetta Dakin, CRNA Pre-anesthesia Checklist: Patient being monitored, Timeout performed, Suction available, Emergency Drugs available and Patient identified Patient Re-evaluated:Patient Re-evaluated prior to induction Oxygen Delivery Method: Nasal cannula

## 2019-09-14 NOTE — Anesthesia Procedure Notes (Signed)
Procedure Name: MAC Performed by: Izetta Dakin, CRNA Pre-anesthesia Checklist: Timeout performed, Patient being monitored, Suction available, Emergency Drugs available and Patient identified Patient Re-evaluated:Patient Re-evaluated prior to induction Oxygen Delivery Method: Nasal cannula

## 2019-09-14 NOTE — H&P (Signed)

## 2019-09-14 NOTE — Transfer of Care (Signed)
Immediate Anesthesia Transfer of Care Note  Patient: Darrell Dickerson  Procedure(s) Performed: CATARACT EXTRACTION PHACO AND INTRAOCULAR LENS PLACEMENT (IOC) RIGHT DIABETIC 16.64, 01:26.3, 47.9% (Right Eye)  Patient Location: PACU  Anesthesia Type: MAC  Level of Consciousness: awake, alert  and patient cooperative  Airway and Oxygen Therapy: Patient Spontanous Breathing and Patient connected to supplemental oxygen  Post-op Assessment: Post-op Vital signs reviewed, Patient's Cardiovascular Status Stable, Respiratory Function Stable, Patent Airway and No signs of Nausea or vomiting  Post-op Vital Signs: Reviewed and stable  Complications: No apparent anesthesia complications

## 2019-09-14 NOTE — Op Note (Signed)
LOCATION:  [place]   PREOPERATIVE DIAGNOSIS:    Nuclear sclerotic cataract right eye. H25.11   POSTOPERATIVE DIAGNOSIS:  Nuclear sclerotic cataract right eye.     PROCEDURE:  Phacoemusification with posterior chamber intraocular lens placement of the right eye   ULTRASOUND TIME: Procedure(s) with comments: CATARACT EXTRACTION PHACO AND INTRAOCULAR LENS PLACEMENT (IOC) RIGHT DIABETIC 16.64, 01:26.3, 47.9% (Right) - Diabetic - oral meds  LENS:   Implant Name Type Inv. Item Serial No. Manufacturer Lot No. LRB No. Used Action  LENS IOL ACRYSOF IQ 25.5 - [id number] Intraocular Lens LENS IOL ACRYSOF IQ 25.5 [id number] ALCON  Right 1 Implanted         SURGEON:  Wyonia Hough, MD   ANESTHESIA:  Topical with tetracaine drops and 2% Xylocaine jelly, augmented with 1% preservative-free intracameral lidocaine.    COMPLICATIONS:  None.   DESCRIPTION OF PROCEDURE:  The patient was identified in the holding room and transported to the operating room and placed in the supine position under the operating microscope.  The right eye was identified as the operative eye and it was prepped and draped in the usual sterile ophthalmic fashion.   A 1 millimeter clear-corneal paracentesis was made at the 12:00 position.  0.5 ml of preservative-free 1% lidocaine was injected into the anterior chamber. The anterior chamber was filled with Viscoat viscoelastic.  A 2.4 millimeter keratome was used to make a near-clear corneal incision at the 9:00 position.  A curvilinear capsulorrhexis was made with a cystotome and capsulorrhexis forceps.  Balanced salt solution was used to hydrodissect and hydrodelineate the nucleus.   Phacoemulsification was then used in stop and chop fashion to remove the lens nucleus and epinucleus.  The remaining cortex was then removed using the irrigation and aspiration handpiece. Provisc was then placed into the capsular bag to distend it for lens placement.  A lens  was then injected into the capsular bag.  The remaining viscoelastic was aspirated.   Wounds were hydrated with balanced salt solution.  The anterior chamber was inflated to a physiologic pressure with balanced salt solution.  No wound leaks were noted. Cefuroxime 0.1 ml of a 10mg /ml solution was injected into the anterior chamber for a dose of 1 mg of intracameral antibiotic at the completion of the case.   Timolol and Brimonidine drops were applied to the eye.  The patient was taken to the recovery room in stable condition without complications of anesthesia or surgery.   Dorsey Authement 09/14/2019, 8:55 AM

## 2019-09-14 NOTE — Anesthesia Postprocedure Evaluation (Signed)
Anesthesia Post Note  Patient: Darrell Dickerson  Procedure(s) Performed: CATARACT EXTRACTION PHACO AND INTRAOCULAR LENS PLACEMENT (IOC) RIGHT DIABETIC 16.64, 01:26.3, 47.9% (Right Eye)     Patient location during evaluation: PACU Anesthesia Type: MAC Level of consciousness: awake and alert Pain management: pain level controlled Vital Signs Assessment: post-procedure vital signs reviewed and stable Respiratory status: spontaneous breathing, nonlabored ventilation, respiratory function stable and patient connected to nasal cannula oxygen Cardiovascular status: stable and blood pressure returned to baseline Postop Assessment: no apparent nausea or vomiting Anesthetic complications: no    Veda Canning

## 2019-09-14 NOTE — Anesthesia Preprocedure Evaluation (Addendum)
Anesthesia Evaluation  Patient identified by MRN, date of birth, ID band Patient awake    Reviewed: Allergy & Precautions, NPO status   Airway Mallampati: II  TM Distance: >3 FB     Dental   Pulmonary former smoker,    breath sounds clear to auscultation       Cardiovascular hypertension, + CAD ( ) and +CHF (EF 47% 2015)   Rhythm:Regular Rate:Normal     Neuro/Psych    GI/Hepatic GERD  ,  Endo/Other  diabetes  Renal/GU Renal disease (CKD stage 3)     Musculoskeletal   Abdominal   Peds  Hematology   Anesthesia Other Findings   Reproductive/Obstetrics                            Anesthesia Physical Anesthesia Plan  ASA: III  Anesthesia Plan: MAC   Post-op Pain Management:    Induction:   PONV Risk Score and Plan: 1 and Treatment may vary due to age or medical condition and Midazolam  Airway Management Planned: Nasal Cannula  Additional Equipment:   Intra-op Plan:   Post-operative Plan:   Informed Consent: I have reviewed the patients History and Physical, chart, labs and discussed the procedure including the risks, benefits and alternatives for the proposed anesthesia with the patient or authorized representative who has indicated his/her understanding and acceptance.       Plan Discussed with: CRNA  Anesthesia Plan Comments:         Anesthesia Quick Evaluation

## 2019-09-15 ENCOUNTER — Encounter: Payer: Self-pay | Admitting: Ophthalmology

## 2019-10-18 DIAGNOSIS — Z961 Presence of intraocular lens: Secondary | ICD-10-CM | POA: Diagnosis not present

## 2019-10-21 DIAGNOSIS — E1169 Type 2 diabetes mellitus with other specified complication: Secondary | ICD-10-CM | POA: Diagnosis not present

## 2019-10-21 DIAGNOSIS — Z9861 Coronary angioplasty status: Secondary | ICD-10-CM | POA: Diagnosis not present

## 2019-10-21 DIAGNOSIS — I2510 Atherosclerotic heart disease of native coronary artery without angina pectoris: Secondary | ICD-10-CM | POA: Diagnosis not present

## 2019-10-21 DIAGNOSIS — I10 Essential (primary) hypertension: Secondary | ICD-10-CM | POA: Diagnosis not present

## 2019-10-21 DIAGNOSIS — E785 Hyperlipidemia, unspecified: Secondary | ICD-10-CM | POA: Diagnosis not present

## 2019-10-21 DIAGNOSIS — N1832 Chronic kidney disease, stage 3b: Secondary | ICD-10-CM | POA: Diagnosis not present

## 2019-10-21 DIAGNOSIS — I5022 Chronic systolic (congestive) heart failure: Secondary | ICD-10-CM | POA: Diagnosis not present

## 2019-10-21 DIAGNOSIS — E1159 Type 2 diabetes mellitus with other circulatory complications: Secondary | ICD-10-CM | POA: Diagnosis not present

## 2019-10-21 DIAGNOSIS — E1121 Type 2 diabetes mellitus with diabetic nephropathy: Secondary | ICD-10-CM | POA: Diagnosis not present

## 2019-11-02 DIAGNOSIS — E1122 Type 2 diabetes mellitus with diabetic chronic kidney disease: Secondary | ICD-10-CM | POA: Diagnosis not present

## 2019-11-02 DIAGNOSIS — E1169 Type 2 diabetes mellitus with other specified complication: Secondary | ICD-10-CM | POA: Diagnosis not present

## 2019-11-02 DIAGNOSIS — E1159 Type 2 diabetes mellitus with other circulatory complications: Secondary | ICD-10-CM | POA: Diagnosis not present

## 2019-11-02 DIAGNOSIS — I10 Essential (primary) hypertension: Secondary | ICD-10-CM | POA: Diagnosis not present

## 2019-11-02 DIAGNOSIS — N183 Chronic kidney disease, stage 3 unspecified: Secondary | ICD-10-CM | POA: Diagnosis not present

## 2019-11-02 DIAGNOSIS — E785 Hyperlipidemia, unspecified: Secondary | ICD-10-CM | POA: Diagnosis not present

## 2019-11-23 DIAGNOSIS — E1159 Type 2 diabetes mellitus with other circulatory complications: Secondary | ICD-10-CM | POA: Diagnosis not present

## 2019-11-23 DIAGNOSIS — N1832 Chronic kidney disease, stage 3b: Secondary | ICD-10-CM | POA: Diagnosis not present

## 2019-11-23 DIAGNOSIS — Z79899 Other long term (current) drug therapy: Secondary | ICD-10-CM | POA: Diagnosis not present

## 2019-11-23 DIAGNOSIS — E1121 Type 2 diabetes mellitus with diabetic nephropathy: Secondary | ICD-10-CM | POA: Diagnosis not present

## 2019-11-23 DIAGNOSIS — I10 Essential (primary) hypertension: Secondary | ICD-10-CM | POA: Diagnosis not present

## 2019-11-30 DIAGNOSIS — Z0000 Encounter for general adult medical examination without abnormal findings: Secondary | ICD-10-CM | POA: Diagnosis not present

## 2019-11-30 DIAGNOSIS — N189 Chronic kidney disease, unspecified: Secondary | ICD-10-CM | POA: Diagnosis not present

## 2019-11-30 DIAGNOSIS — I129 Hypertensive chronic kidney disease with stage 1 through stage 4 chronic kidney disease, or unspecified chronic kidney disease: Secondary | ICD-10-CM | POA: Diagnosis not present

## 2019-11-30 DIAGNOSIS — E1122 Type 2 diabetes mellitus with diabetic chronic kidney disease: Secondary | ICD-10-CM | POA: Diagnosis not present

## 2019-11-30 DIAGNOSIS — Z79899 Other long term (current) drug therapy: Secondary | ICD-10-CM | POA: Diagnosis not present

## 2019-11-30 DIAGNOSIS — I2510 Atherosclerotic heart disease of native coronary artery without angina pectoris: Secondary | ICD-10-CM | POA: Diagnosis not present

## 2019-11-30 DIAGNOSIS — E785 Hyperlipidemia, unspecified: Secondary | ICD-10-CM | POA: Diagnosis not present

## 2019-12-16 DIAGNOSIS — E1159 Type 2 diabetes mellitus with other circulatory complications: Secondary | ICD-10-CM | POA: Diagnosis not present

## 2019-12-16 DIAGNOSIS — I152 Hypertension secondary to endocrine disorders: Secondary | ICD-10-CM | POA: Diagnosis not present

## 2019-12-16 DIAGNOSIS — Z87891 Personal history of nicotine dependence: Secondary | ICD-10-CM | POA: Diagnosis not present

## 2020-02-01 DIAGNOSIS — I5022 Chronic systolic (congestive) heart failure: Secondary | ICD-10-CM | POA: Diagnosis not present

## 2020-02-01 DIAGNOSIS — E1159 Type 2 diabetes mellitus with other circulatory complications: Secondary | ICD-10-CM | POA: Diagnosis not present

## 2020-02-01 DIAGNOSIS — Z87891 Personal history of nicotine dependence: Secondary | ICD-10-CM | POA: Diagnosis not present

## 2020-02-01 DIAGNOSIS — I152 Hypertension secondary to endocrine disorders: Secondary | ICD-10-CM | POA: Diagnosis not present

## 2020-02-03 DIAGNOSIS — Z87891 Personal history of nicotine dependence: Secondary | ICD-10-CM | POA: Diagnosis not present

## 2020-02-03 DIAGNOSIS — E1159 Type 2 diabetes mellitus with other circulatory complications: Secondary | ICD-10-CM | POA: Diagnosis not present

## 2020-02-03 DIAGNOSIS — I152 Hypertension secondary to endocrine disorders: Secondary | ICD-10-CM | POA: Diagnosis not present

## 2020-03-02 DIAGNOSIS — E1159 Type 2 diabetes mellitus with other circulatory complications: Secondary | ICD-10-CM | POA: Diagnosis not present

## 2020-03-02 DIAGNOSIS — E1169 Type 2 diabetes mellitus with other specified complication: Secondary | ICD-10-CM | POA: Diagnosis not present

## 2020-03-02 DIAGNOSIS — E785 Hyperlipidemia, unspecified: Secondary | ICD-10-CM | POA: Diagnosis not present

## 2020-03-02 DIAGNOSIS — N183 Chronic kidney disease, stage 3 unspecified: Secondary | ICD-10-CM | POA: Diagnosis not present

## 2020-03-02 DIAGNOSIS — I152 Hypertension secondary to endocrine disorders: Secondary | ICD-10-CM | POA: Diagnosis not present

## 2020-03-02 DIAGNOSIS — E1122 Type 2 diabetes mellitus with diabetic chronic kidney disease: Secondary | ICD-10-CM | POA: Diagnosis not present

## 2020-03-02 DIAGNOSIS — Z79899 Other long term (current) drug therapy: Secondary | ICD-10-CM | POA: Diagnosis not present

## 2020-03-07 DIAGNOSIS — E785 Hyperlipidemia, unspecified: Secondary | ICD-10-CM | POA: Diagnosis not present

## 2020-03-07 DIAGNOSIS — N183 Chronic kidney disease, stage 3 unspecified: Secondary | ICD-10-CM | POA: Diagnosis not present

## 2020-03-07 DIAGNOSIS — E1169 Type 2 diabetes mellitus with other specified complication: Secondary | ICD-10-CM | POA: Diagnosis not present

## 2020-03-07 DIAGNOSIS — I152 Hypertension secondary to endocrine disorders: Secondary | ICD-10-CM | POA: Diagnosis not present

## 2020-03-07 DIAGNOSIS — E1159 Type 2 diabetes mellitus with other circulatory complications: Secondary | ICD-10-CM | POA: Diagnosis not present

## 2020-03-07 DIAGNOSIS — E1122 Type 2 diabetes mellitus with diabetic chronic kidney disease: Secondary | ICD-10-CM | POA: Diagnosis not present

## 2020-03-09 DIAGNOSIS — I152 Hypertension secondary to endocrine disorders: Secondary | ICD-10-CM | POA: Diagnosis not present

## 2020-03-09 DIAGNOSIS — E1169 Type 2 diabetes mellitus with other specified complication: Secondary | ICD-10-CM | POA: Diagnosis not present

## 2020-03-09 DIAGNOSIS — I2510 Atherosclerotic heart disease of native coronary artery without angina pectoris: Secondary | ICD-10-CM | POA: Diagnosis not present

## 2020-03-09 DIAGNOSIS — Z79899 Other long term (current) drug therapy: Secondary | ICD-10-CM | POA: Diagnosis not present

## 2020-03-09 DIAGNOSIS — E1159 Type 2 diabetes mellitus with other circulatory complications: Secondary | ICD-10-CM | POA: Diagnosis not present

## 2020-03-09 DIAGNOSIS — Z87891 Personal history of nicotine dependence: Secondary | ICD-10-CM | POA: Diagnosis not present

## 2020-03-09 DIAGNOSIS — E1122 Type 2 diabetes mellitus with diabetic chronic kidney disease: Secondary | ICD-10-CM | POA: Diagnosis not present

## 2020-03-09 DIAGNOSIS — Z9861 Coronary angioplasty status: Secondary | ICD-10-CM | POA: Diagnosis not present

## 2020-03-09 DIAGNOSIS — N183 Chronic kidney disease, stage 3 unspecified: Secondary | ICD-10-CM | POA: Diagnosis not present

## 2020-04-26 DIAGNOSIS — E119 Type 2 diabetes mellitus without complications: Secondary | ICD-10-CM | POA: Diagnosis not present

## 2020-05-02 DIAGNOSIS — E785 Hyperlipidemia, unspecified: Secondary | ICD-10-CM | POA: Diagnosis not present

## 2020-05-02 DIAGNOSIS — E1159 Type 2 diabetes mellitus with other circulatory complications: Secondary | ICD-10-CM | POA: Diagnosis not present

## 2020-05-02 DIAGNOSIS — I152 Hypertension secondary to endocrine disorders: Secondary | ICD-10-CM | POA: Diagnosis not present

## 2020-05-02 DIAGNOSIS — I2510 Atherosclerotic heart disease of native coronary artery without angina pectoris: Secondary | ICD-10-CM | POA: Diagnosis not present

## 2020-05-02 DIAGNOSIS — Z9861 Coronary angioplasty status: Secondary | ICD-10-CM | POA: Diagnosis not present

## 2020-05-02 DIAGNOSIS — E1169 Type 2 diabetes mellitus with other specified complication: Secondary | ICD-10-CM | POA: Diagnosis not present

## 2020-05-02 DIAGNOSIS — I5022 Chronic systolic (congestive) heart failure: Secondary | ICD-10-CM | POA: Diagnosis not present

## 2020-05-02 DIAGNOSIS — N1832 Chronic kidney disease, stage 3b: Secondary | ICD-10-CM | POA: Diagnosis not present

## 2020-07-04 DIAGNOSIS — E1159 Type 2 diabetes mellitus with other circulatory complications: Secondary | ICD-10-CM | POA: Diagnosis not present

## 2020-07-04 DIAGNOSIS — N183 Chronic kidney disease, stage 3 unspecified: Secondary | ICD-10-CM | POA: Diagnosis not present

## 2020-07-04 DIAGNOSIS — I152 Hypertension secondary to endocrine disorders: Secondary | ICD-10-CM | POA: Diagnosis not present

## 2020-07-04 DIAGNOSIS — E1122 Type 2 diabetes mellitus with diabetic chronic kidney disease: Secondary | ICD-10-CM | POA: Diagnosis not present

## 2020-07-04 DIAGNOSIS — Z79899 Other long term (current) drug therapy: Secondary | ICD-10-CM | POA: Diagnosis not present

## 2020-07-11 DIAGNOSIS — E1159 Type 2 diabetes mellitus with other circulatory complications: Secondary | ICD-10-CM | POA: Diagnosis not present

## 2020-07-11 DIAGNOSIS — N1832 Chronic kidney disease, stage 3b: Secondary | ICD-10-CM | POA: Diagnosis not present

## 2020-07-11 DIAGNOSIS — Z79899 Other long term (current) drug therapy: Secondary | ICD-10-CM | POA: Diagnosis not present

## 2020-07-11 DIAGNOSIS — E785 Hyperlipidemia, unspecified: Secondary | ICD-10-CM | POA: Diagnosis not present

## 2020-07-11 DIAGNOSIS — I152 Hypertension secondary to endocrine disorders: Secondary | ICD-10-CM | POA: Diagnosis not present

## 2020-07-11 DIAGNOSIS — Z23 Encounter for immunization: Secondary | ICD-10-CM | POA: Diagnosis not present

## 2020-07-11 DIAGNOSIS — I10 Essential (primary) hypertension: Secondary | ICD-10-CM | POA: Diagnosis not present

## 2020-07-11 DIAGNOSIS — N183 Chronic kidney disease, stage 3 unspecified: Secondary | ICD-10-CM | POA: Diagnosis not present

## 2020-07-11 DIAGNOSIS — E1169 Type 2 diabetes mellitus with other specified complication: Secondary | ICD-10-CM | POA: Diagnosis not present

## 2020-07-11 DIAGNOSIS — E1122 Type 2 diabetes mellitus with diabetic chronic kidney disease: Secondary | ICD-10-CM | POA: Diagnosis not present

## 2020-08-14 DIAGNOSIS — C44329 Squamous cell carcinoma of skin of other parts of face: Secondary | ICD-10-CM | POA: Diagnosis not present

## 2020-08-14 DIAGNOSIS — D485 Neoplasm of uncertain behavior of skin: Secondary | ICD-10-CM | POA: Diagnosis not present

## 2020-09-03 DIAGNOSIS — C44329 Squamous cell carcinoma of skin of other parts of face: Secondary | ICD-10-CM | POA: Diagnosis not present

## 2020-10-24 DIAGNOSIS — E1169 Type 2 diabetes mellitus with other specified complication: Secondary | ICD-10-CM | POA: Diagnosis not present

## 2020-10-24 DIAGNOSIS — E785 Hyperlipidemia, unspecified: Secondary | ICD-10-CM | POA: Diagnosis not present

## 2020-10-24 DIAGNOSIS — I5022 Chronic systolic (congestive) heart failure: Secondary | ICD-10-CM | POA: Diagnosis not present

## 2020-10-24 DIAGNOSIS — E1159 Type 2 diabetes mellitus with other circulatory complications: Secondary | ICD-10-CM | POA: Diagnosis not present

## 2020-10-24 DIAGNOSIS — Z9861 Coronary angioplasty status: Secondary | ICD-10-CM | POA: Diagnosis not present

## 2020-10-24 DIAGNOSIS — I152 Hypertension secondary to endocrine disorders: Secondary | ICD-10-CM | POA: Diagnosis not present

## 2020-10-24 DIAGNOSIS — I2510 Atherosclerotic heart disease of native coronary artery without angina pectoris: Secondary | ICD-10-CM | POA: Diagnosis not present

## 2020-11-05 DIAGNOSIS — E785 Hyperlipidemia, unspecified: Secondary | ICD-10-CM | POA: Diagnosis not present

## 2020-11-05 DIAGNOSIS — I152 Hypertension secondary to endocrine disorders: Secondary | ICD-10-CM | POA: Diagnosis not present

## 2020-11-05 DIAGNOSIS — E1122 Type 2 diabetes mellitus with diabetic chronic kidney disease: Secondary | ICD-10-CM | POA: Diagnosis not present

## 2020-11-05 DIAGNOSIS — N183 Chronic kidney disease, stage 3 unspecified: Secondary | ICD-10-CM | POA: Diagnosis not present

## 2020-11-05 DIAGNOSIS — Z79899 Other long term (current) drug therapy: Secondary | ICD-10-CM | POA: Diagnosis not present

## 2020-11-05 DIAGNOSIS — E1159 Type 2 diabetes mellitus with other circulatory complications: Secondary | ICD-10-CM | POA: Diagnosis not present

## 2020-11-05 DIAGNOSIS — E1169 Type 2 diabetes mellitus with other specified complication: Secondary | ICD-10-CM | POA: Diagnosis not present

## 2020-11-12 DIAGNOSIS — I2510 Atherosclerotic heart disease of native coronary artery without angina pectoris: Secondary | ICD-10-CM | POA: Diagnosis not present

## 2020-11-12 DIAGNOSIS — E1122 Type 2 diabetes mellitus with diabetic chronic kidney disease: Secondary | ICD-10-CM | POA: Diagnosis not present

## 2020-11-12 DIAGNOSIS — I10 Essential (primary) hypertension: Secondary | ICD-10-CM | POA: Diagnosis not present

## 2020-11-12 DIAGNOSIS — Z9861 Coronary angioplasty status: Secondary | ICD-10-CM | POA: Diagnosis not present

## 2020-11-12 DIAGNOSIS — E785 Hyperlipidemia, unspecified: Secondary | ICD-10-CM | POA: Diagnosis not present

## 2020-11-12 DIAGNOSIS — E1169 Type 2 diabetes mellitus with other specified complication: Secondary | ICD-10-CM | POA: Diagnosis not present

## 2020-11-12 DIAGNOSIS — N1832 Chronic kidney disease, stage 3b: Secondary | ICD-10-CM | POA: Diagnosis not present

## 2020-11-22 DIAGNOSIS — E785 Hyperlipidemia, unspecified: Secondary | ICD-10-CM | POA: Diagnosis not present

## 2020-11-22 DIAGNOSIS — E1122 Type 2 diabetes mellitus with diabetic chronic kidney disease: Secondary | ICD-10-CM | POA: Diagnosis not present

## 2020-11-22 DIAGNOSIS — E1169 Type 2 diabetes mellitus with other specified complication: Secondary | ICD-10-CM | POA: Diagnosis not present

## 2020-11-22 DIAGNOSIS — E1159 Type 2 diabetes mellitus with other circulatory complications: Secondary | ICD-10-CM | POA: Diagnosis not present

## 2020-11-22 DIAGNOSIS — I152 Hypertension secondary to endocrine disorders: Secondary | ICD-10-CM | POA: Diagnosis not present

## 2020-11-22 DIAGNOSIS — N183 Chronic kidney disease, stage 3 unspecified: Secondary | ICD-10-CM | POA: Diagnosis not present

## 2020-12-10 DIAGNOSIS — I152 Hypertension secondary to endocrine disorders: Secondary | ICD-10-CM | POA: Diagnosis not present

## 2020-12-10 DIAGNOSIS — E1159 Type 2 diabetes mellitus with other circulatory complications: Secondary | ICD-10-CM | POA: Diagnosis not present

## 2021-02-27 DIAGNOSIS — I2510 Atherosclerotic heart disease of native coronary artery without angina pectoris: Secondary | ICD-10-CM | POA: Diagnosis not present

## 2021-02-27 DIAGNOSIS — N189 Chronic kidney disease, unspecified: Secondary | ICD-10-CM | POA: Diagnosis not present

## 2021-02-27 DIAGNOSIS — I129 Hypertensive chronic kidney disease with stage 1 through stage 4 chronic kidney disease, or unspecified chronic kidney disease: Secondary | ICD-10-CM | POA: Diagnosis not present

## 2021-02-27 DIAGNOSIS — Z79899 Other long term (current) drug therapy: Secondary | ICD-10-CM | POA: Diagnosis not present

## 2021-02-27 DIAGNOSIS — E1122 Type 2 diabetes mellitus with diabetic chronic kidney disease: Secondary | ICD-10-CM | POA: Diagnosis not present

## 2021-02-27 DIAGNOSIS — E785 Hyperlipidemia, unspecified: Secondary | ICD-10-CM | POA: Diagnosis not present

## 2021-02-27 DIAGNOSIS — Z0000 Encounter for general adult medical examination without abnormal findings: Secondary | ICD-10-CM | POA: Diagnosis not present

## 2021-03-27 DIAGNOSIS — I152 Hypertension secondary to endocrine disorders: Secondary | ICD-10-CM | POA: Diagnosis not present

## 2021-03-27 DIAGNOSIS — E1122 Type 2 diabetes mellitus with diabetic chronic kidney disease: Secondary | ICD-10-CM | POA: Diagnosis not present

## 2021-03-27 DIAGNOSIS — E1169 Type 2 diabetes mellitus with other specified complication: Secondary | ICD-10-CM | POA: Diagnosis not present

## 2021-03-27 DIAGNOSIS — E1159 Type 2 diabetes mellitus with other circulatory complications: Secondary | ICD-10-CM | POA: Diagnosis not present

## 2021-03-27 DIAGNOSIS — N183 Chronic kidney disease, stage 3 unspecified: Secondary | ICD-10-CM | POA: Diagnosis not present

## 2021-03-27 DIAGNOSIS — E785 Hyperlipidemia, unspecified: Secondary | ICD-10-CM | POA: Diagnosis not present

## 2021-04-29 DIAGNOSIS — E113293 Type 2 diabetes mellitus with mild nonproliferative diabetic retinopathy without macular edema, bilateral: Secondary | ICD-10-CM | POA: Diagnosis not present

## 2021-05-14 DIAGNOSIS — E785 Hyperlipidemia, unspecified: Secondary | ICD-10-CM | POA: Diagnosis not present

## 2021-05-14 DIAGNOSIS — I519 Heart disease, unspecified: Secondary | ICD-10-CM | POA: Diagnosis not present

## 2021-05-14 DIAGNOSIS — E1169 Type 2 diabetes mellitus with other specified complication: Secondary | ICD-10-CM | POA: Diagnosis not present

## 2021-05-14 DIAGNOSIS — E1159 Type 2 diabetes mellitus with other circulatory complications: Secondary | ICD-10-CM | POA: Diagnosis not present

## 2021-05-14 DIAGNOSIS — E1122 Type 2 diabetes mellitus with diabetic chronic kidney disease: Secondary | ICD-10-CM | POA: Diagnosis not present

## 2021-05-14 DIAGNOSIS — N1832 Chronic kidney disease, stage 3b: Secondary | ICD-10-CM | POA: Diagnosis not present

## 2021-05-14 DIAGNOSIS — Z9861 Coronary angioplasty status: Secondary | ICD-10-CM | POA: Diagnosis not present

## 2021-05-14 DIAGNOSIS — I5022 Chronic systolic (congestive) heart failure: Secondary | ICD-10-CM | POA: Diagnosis not present

## 2021-05-14 DIAGNOSIS — I2510 Atherosclerotic heart disease of native coronary artery without angina pectoris: Secondary | ICD-10-CM | POA: Diagnosis not present

## 2021-06-13 DIAGNOSIS — C441292 Squamous cell carcinoma of skin of left lower eyelid, including canthus: Secondary | ICD-10-CM | POA: Diagnosis not present

## 2021-06-13 DIAGNOSIS — Z859 Personal history of malignant neoplasm, unspecified: Secondary | ICD-10-CM | POA: Diagnosis not present

## 2021-06-13 DIAGNOSIS — D485 Neoplasm of uncertain behavior of skin: Secondary | ICD-10-CM | POA: Diagnosis not present

## 2021-06-13 DIAGNOSIS — L821 Other seborrheic keratosis: Secondary | ICD-10-CM | POA: Diagnosis not present

## 2021-06-13 DIAGNOSIS — L570 Actinic keratosis: Secondary | ICD-10-CM | POA: Diagnosis not present

## 2021-06-20 DIAGNOSIS — I2510 Atherosclerotic heart disease of native coronary artery without angina pectoris: Secondary | ICD-10-CM | POA: Diagnosis not present

## 2021-06-20 DIAGNOSIS — E785 Hyperlipidemia, unspecified: Secondary | ICD-10-CM | POA: Diagnosis not present

## 2021-06-20 DIAGNOSIS — N183 Chronic kidney disease, stage 3 unspecified: Secondary | ICD-10-CM | POA: Diagnosis not present

## 2021-06-20 DIAGNOSIS — E1169 Type 2 diabetes mellitus with other specified complication: Secondary | ICD-10-CM | POA: Diagnosis not present

## 2021-06-20 DIAGNOSIS — Z794 Long term (current) use of insulin: Secondary | ICD-10-CM | POA: Diagnosis not present

## 2021-06-20 DIAGNOSIS — Z9861 Coronary angioplasty status: Secondary | ICD-10-CM | POA: Diagnosis not present

## 2021-06-20 DIAGNOSIS — Z79899 Other long term (current) drug therapy: Secondary | ICD-10-CM | POA: Diagnosis not present

## 2021-06-20 DIAGNOSIS — N1832 Chronic kidney disease, stage 3b: Secondary | ICD-10-CM | POA: Diagnosis not present

## 2021-06-20 DIAGNOSIS — E1122 Type 2 diabetes mellitus with diabetic chronic kidney disease: Secondary | ICD-10-CM | POA: Diagnosis not present

## 2021-06-20 DIAGNOSIS — Z0000 Encounter for general adult medical examination without abnormal findings: Secondary | ICD-10-CM | POA: Diagnosis not present

## 2021-07-10 DIAGNOSIS — C441091 Unspecified malignant neoplasm of skin of left upper eyelid, including canthus: Secondary | ICD-10-CM | POA: Diagnosis not present

## 2021-07-10 DIAGNOSIS — C441292 Squamous cell carcinoma of skin of left lower eyelid, including canthus: Secondary | ICD-10-CM | POA: Diagnosis not present

## 2021-07-30 DIAGNOSIS — E1159 Type 2 diabetes mellitus with other circulatory complications: Secondary | ICD-10-CM | POA: Diagnosis not present

## 2021-07-30 DIAGNOSIS — E785 Hyperlipidemia, unspecified: Secondary | ICD-10-CM | POA: Diagnosis not present

## 2021-07-30 DIAGNOSIS — I152 Hypertension secondary to endocrine disorders: Secondary | ICD-10-CM | POA: Diagnosis not present

## 2021-07-30 DIAGNOSIS — N183 Chronic kidney disease, stage 3 unspecified: Secondary | ICD-10-CM | POA: Diagnosis not present

## 2021-07-30 DIAGNOSIS — E1122 Type 2 diabetes mellitus with diabetic chronic kidney disease: Secondary | ICD-10-CM | POA: Diagnosis not present

## 2021-07-30 DIAGNOSIS — E1169 Type 2 diabetes mellitus with other specified complication: Secondary | ICD-10-CM | POA: Diagnosis not present

## 2021-08-15 DIAGNOSIS — Z23 Encounter for immunization: Secondary | ICD-10-CM | POA: Diagnosis not present

## 2021-09-27 DIAGNOSIS — M5136 Other intervertebral disc degeneration, lumbar region: Secondary | ICD-10-CM | POA: Diagnosis not present

## 2021-09-27 DIAGNOSIS — M4316 Spondylolisthesis, lumbar region: Secondary | ICD-10-CM | POA: Diagnosis not present

## 2021-09-27 DIAGNOSIS — M545 Low back pain, unspecified: Secondary | ICD-10-CM | POA: Diagnosis not present

## 2021-10-21 DIAGNOSIS — N183 Chronic kidney disease, stage 3 unspecified: Secondary | ICD-10-CM | POA: Diagnosis not present

## 2021-10-21 DIAGNOSIS — Z9861 Coronary angioplasty status: Secondary | ICD-10-CM | POA: Diagnosis not present

## 2021-10-21 DIAGNOSIS — E1169 Type 2 diabetes mellitus with other specified complication: Secondary | ICD-10-CM | POA: Diagnosis not present

## 2021-10-21 DIAGNOSIS — E785 Hyperlipidemia, unspecified: Secondary | ICD-10-CM | POA: Diagnosis not present

## 2021-10-21 DIAGNOSIS — E1159 Type 2 diabetes mellitus with other circulatory complications: Secondary | ICD-10-CM | POA: Diagnosis not present

## 2021-10-21 DIAGNOSIS — E1122 Type 2 diabetes mellitus with diabetic chronic kidney disease: Secondary | ICD-10-CM | POA: Diagnosis not present

## 2021-10-21 DIAGNOSIS — I2510 Atherosclerotic heart disease of native coronary artery without angina pectoris: Secondary | ICD-10-CM | POA: Diagnosis not present

## 2021-10-21 DIAGNOSIS — I10 Essential (primary) hypertension: Secondary | ICD-10-CM | POA: Diagnosis not present

## 2021-11-14 DIAGNOSIS — E1169 Type 2 diabetes mellitus with other specified complication: Secondary | ICD-10-CM | POA: Diagnosis not present

## 2021-11-14 DIAGNOSIS — E1159 Type 2 diabetes mellitus with other circulatory complications: Secondary | ICD-10-CM | POA: Diagnosis not present

## 2021-11-14 DIAGNOSIS — I152 Hypertension secondary to endocrine disorders: Secondary | ICD-10-CM | POA: Diagnosis not present

## 2021-11-14 DIAGNOSIS — I5022 Chronic systolic (congestive) heart failure: Secondary | ICD-10-CM | POA: Diagnosis not present

## 2021-11-14 DIAGNOSIS — I2510 Atherosclerotic heart disease of native coronary artery without angina pectoris: Secondary | ICD-10-CM | POA: Diagnosis not present

## 2021-11-14 DIAGNOSIS — N183 Chronic kidney disease, stage 3 unspecified: Secondary | ICD-10-CM | POA: Diagnosis not present

## 2021-11-14 DIAGNOSIS — E1122 Type 2 diabetes mellitus with diabetic chronic kidney disease: Secondary | ICD-10-CM | POA: Diagnosis not present

## 2021-11-14 DIAGNOSIS — Z9861 Coronary angioplasty status: Secondary | ICD-10-CM | POA: Diagnosis not present

## 2021-11-14 DIAGNOSIS — E785 Hyperlipidemia, unspecified: Secondary | ICD-10-CM | POA: Diagnosis not present

## 2022-01-22 DIAGNOSIS — I129 Hypertensive chronic kidney disease with stage 1 through stage 4 chronic kidney disease, or unspecified chronic kidney disease: Secondary | ICD-10-CM | POA: Diagnosis not present

## 2022-01-22 DIAGNOSIS — Z79899 Other long term (current) drug therapy: Secondary | ICD-10-CM | POA: Diagnosis not present

## 2022-01-22 DIAGNOSIS — E1122 Type 2 diabetes mellitus with diabetic chronic kidney disease: Secondary | ICD-10-CM | POA: Diagnosis not present

## 2022-01-22 DIAGNOSIS — Z0000 Encounter for general adult medical examination without abnormal findings: Secondary | ICD-10-CM | POA: Diagnosis not present

## 2022-01-22 DIAGNOSIS — N189 Chronic kidney disease, unspecified: Secondary | ICD-10-CM | POA: Diagnosis not present

## 2022-01-22 DIAGNOSIS — E785 Hyperlipidemia, unspecified: Secondary | ICD-10-CM | POA: Diagnosis not present

## 2022-01-22 DIAGNOSIS — Z9861 Coronary angioplasty status: Secondary | ICD-10-CM | POA: Diagnosis not present

## 2022-01-22 DIAGNOSIS — I2510 Atherosclerotic heart disease of native coronary artery without angina pectoris: Secondary | ICD-10-CM | POA: Diagnosis not present

## 2022-02-05 DIAGNOSIS — E1169 Type 2 diabetes mellitus with other specified complication: Secondary | ICD-10-CM | POA: Diagnosis not present

## 2022-02-05 DIAGNOSIS — N183 Chronic kidney disease, stage 3 unspecified: Secondary | ICD-10-CM | POA: Diagnosis not present

## 2022-02-05 DIAGNOSIS — E785 Hyperlipidemia, unspecified: Secondary | ICD-10-CM | POA: Diagnosis not present

## 2022-02-05 DIAGNOSIS — E1159 Type 2 diabetes mellitus with other circulatory complications: Secondary | ICD-10-CM | POA: Diagnosis not present

## 2022-02-05 DIAGNOSIS — E1122 Type 2 diabetes mellitus with diabetic chronic kidney disease: Secondary | ICD-10-CM | POA: Diagnosis not present

## 2022-02-05 DIAGNOSIS — I152 Hypertension secondary to endocrine disorders: Secondary | ICD-10-CM | POA: Diagnosis not present

## 2022-05-01 DIAGNOSIS — Z0100 Encounter for examination of eyes and vision without abnormal findings: Secondary | ICD-10-CM | POA: Diagnosis not present

## 2022-05-01 DIAGNOSIS — Z961 Presence of intraocular lens: Secondary | ICD-10-CM | POA: Diagnosis not present

## 2022-05-01 DIAGNOSIS — E119 Type 2 diabetes mellitus without complications: Secondary | ICD-10-CM | POA: Diagnosis not present

## 2022-05-28 DIAGNOSIS — E1169 Type 2 diabetes mellitus with other specified complication: Secondary | ICD-10-CM | POA: Diagnosis not present

## 2022-05-28 DIAGNOSIS — N183 Chronic kidney disease, stage 3 unspecified: Secondary | ICD-10-CM | POA: Diagnosis not present

## 2022-05-28 DIAGNOSIS — E1122 Type 2 diabetes mellitus with diabetic chronic kidney disease: Secondary | ICD-10-CM | POA: Diagnosis not present

## 2022-05-28 DIAGNOSIS — Z9861 Coronary angioplasty status: Secondary | ICD-10-CM | POA: Diagnosis not present

## 2022-05-28 DIAGNOSIS — I129 Hypertensive chronic kidney disease with stage 1 through stage 4 chronic kidney disease, or unspecified chronic kidney disease: Secondary | ICD-10-CM | POA: Diagnosis not present

## 2022-05-28 DIAGNOSIS — I2510 Atherosclerotic heart disease of native coronary artery without angina pectoris: Secondary | ICD-10-CM | POA: Diagnosis not present

## 2022-05-28 DIAGNOSIS — Z79899 Other long term (current) drug therapy: Secondary | ICD-10-CM | POA: Diagnosis not present

## 2022-05-28 DIAGNOSIS — E785 Hyperlipidemia, unspecified: Secondary | ICD-10-CM | POA: Diagnosis not present

## 2022-05-28 DIAGNOSIS — Z87891 Personal history of nicotine dependence: Secondary | ICD-10-CM | POA: Diagnosis not present

## 2022-07-01 DIAGNOSIS — M5416 Radiculopathy, lumbar region: Secondary | ICD-10-CM | POA: Diagnosis not present

## 2022-07-01 DIAGNOSIS — M9903 Segmental and somatic dysfunction of lumbar region: Secondary | ICD-10-CM | POA: Diagnosis not present

## 2022-07-01 DIAGNOSIS — M955 Acquired deformity of pelvis: Secondary | ICD-10-CM | POA: Diagnosis not present

## 2022-07-01 DIAGNOSIS — M9905 Segmental and somatic dysfunction of pelvic region: Secondary | ICD-10-CM | POA: Diagnosis not present

## 2022-07-02 DIAGNOSIS — M955 Acquired deformity of pelvis: Secondary | ICD-10-CM | POA: Diagnosis not present

## 2022-07-02 DIAGNOSIS — M5416 Radiculopathy, lumbar region: Secondary | ICD-10-CM | POA: Diagnosis not present

## 2022-07-02 DIAGNOSIS — M9905 Segmental and somatic dysfunction of pelvic region: Secondary | ICD-10-CM | POA: Diagnosis not present

## 2022-07-02 DIAGNOSIS — M9903 Segmental and somatic dysfunction of lumbar region: Secondary | ICD-10-CM | POA: Diagnosis not present

## 2022-07-03 DIAGNOSIS — M9905 Segmental and somatic dysfunction of pelvic region: Secondary | ICD-10-CM | POA: Diagnosis not present

## 2022-07-03 DIAGNOSIS — M9903 Segmental and somatic dysfunction of lumbar region: Secondary | ICD-10-CM | POA: Diagnosis not present

## 2022-07-03 DIAGNOSIS — M955 Acquired deformity of pelvis: Secondary | ICD-10-CM | POA: Diagnosis not present

## 2022-07-03 DIAGNOSIS — M5416 Radiculopathy, lumbar region: Secondary | ICD-10-CM | POA: Diagnosis not present

## 2022-07-07 DIAGNOSIS — M9905 Segmental and somatic dysfunction of pelvic region: Secondary | ICD-10-CM | POA: Diagnosis not present

## 2022-07-07 DIAGNOSIS — M5416 Radiculopathy, lumbar region: Secondary | ICD-10-CM | POA: Diagnosis not present

## 2022-07-07 DIAGNOSIS — M9903 Segmental and somatic dysfunction of lumbar region: Secondary | ICD-10-CM | POA: Diagnosis not present

## 2022-07-07 DIAGNOSIS — M955 Acquired deformity of pelvis: Secondary | ICD-10-CM | POA: Diagnosis not present

## 2022-07-09 DIAGNOSIS — M5416 Radiculopathy, lumbar region: Secondary | ICD-10-CM | POA: Diagnosis not present

## 2022-07-09 DIAGNOSIS — M9905 Segmental and somatic dysfunction of pelvic region: Secondary | ICD-10-CM | POA: Diagnosis not present

## 2022-07-09 DIAGNOSIS — M955 Acquired deformity of pelvis: Secondary | ICD-10-CM | POA: Diagnosis not present

## 2022-07-09 DIAGNOSIS — M9903 Segmental and somatic dysfunction of lumbar region: Secondary | ICD-10-CM | POA: Diagnosis not present

## 2022-07-10 DIAGNOSIS — M5416 Radiculopathy, lumbar region: Secondary | ICD-10-CM | POA: Diagnosis not present

## 2022-07-10 DIAGNOSIS — M9903 Segmental and somatic dysfunction of lumbar region: Secondary | ICD-10-CM | POA: Diagnosis not present

## 2022-07-10 DIAGNOSIS — M9905 Segmental and somatic dysfunction of pelvic region: Secondary | ICD-10-CM | POA: Diagnosis not present

## 2022-07-10 DIAGNOSIS — M955 Acquired deformity of pelvis: Secondary | ICD-10-CM | POA: Diagnosis not present

## 2022-07-14 DIAGNOSIS — N183 Chronic kidney disease, stage 3 unspecified: Secondary | ICD-10-CM | POA: Diagnosis not present

## 2022-07-14 DIAGNOSIS — M955 Acquired deformity of pelvis: Secondary | ICD-10-CM | POA: Diagnosis not present

## 2022-07-14 DIAGNOSIS — I878 Other specified disorders of veins: Secondary | ICD-10-CM | POA: Diagnosis not present

## 2022-07-14 DIAGNOSIS — I5022 Chronic systolic (congestive) heart failure: Secondary | ICD-10-CM | POA: Diagnosis not present

## 2022-07-14 DIAGNOSIS — M9905 Segmental and somatic dysfunction of pelvic region: Secondary | ICD-10-CM | POA: Diagnosis not present

## 2022-07-14 DIAGNOSIS — E1159 Type 2 diabetes mellitus with other circulatory complications: Secondary | ICD-10-CM | POA: Diagnosis not present

## 2022-07-14 DIAGNOSIS — Z9861 Coronary angioplasty status: Secondary | ICD-10-CM | POA: Diagnosis not present

## 2022-07-14 DIAGNOSIS — I152 Hypertension secondary to endocrine disorders: Secondary | ICD-10-CM | POA: Diagnosis not present

## 2022-07-14 DIAGNOSIS — M9903 Segmental and somatic dysfunction of lumbar region: Secondary | ICD-10-CM | POA: Diagnosis not present

## 2022-07-14 DIAGNOSIS — I2510 Atherosclerotic heart disease of native coronary artery without angina pectoris: Secondary | ICD-10-CM | POA: Diagnosis not present

## 2022-07-14 DIAGNOSIS — E1122 Type 2 diabetes mellitus with diabetic chronic kidney disease: Secondary | ICD-10-CM | POA: Diagnosis not present

## 2022-07-14 DIAGNOSIS — M5416 Radiculopathy, lumbar region: Secondary | ICD-10-CM | POA: Diagnosis not present

## 2022-07-14 DIAGNOSIS — E1169 Type 2 diabetes mellitus with other specified complication: Secondary | ICD-10-CM | POA: Diagnosis not present

## 2022-07-16 DIAGNOSIS — M5416 Radiculopathy, lumbar region: Secondary | ICD-10-CM | POA: Diagnosis not present

## 2022-07-16 DIAGNOSIS — M9903 Segmental and somatic dysfunction of lumbar region: Secondary | ICD-10-CM | POA: Diagnosis not present

## 2022-07-16 DIAGNOSIS — M9905 Segmental and somatic dysfunction of pelvic region: Secondary | ICD-10-CM | POA: Diagnosis not present

## 2022-07-16 DIAGNOSIS — M955 Acquired deformity of pelvis: Secondary | ICD-10-CM | POA: Diagnosis not present

## 2022-07-17 DIAGNOSIS — M955 Acquired deformity of pelvis: Secondary | ICD-10-CM | POA: Diagnosis not present

## 2022-07-17 DIAGNOSIS — M9903 Segmental and somatic dysfunction of lumbar region: Secondary | ICD-10-CM | POA: Diagnosis not present

## 2022-07-17 DIAGNOSIS — M5416 Radiculopathy, lumbar region: Secondary | ICD-10-CM | POA: Diagnosis not present

## 2022-07-17 DIAGNOSIS — M9905 Segmental and somatic dysfunction of pelvic region: Secondary | ICD-10-CM | POA: Diagnosis not present

## 2022-07-22 DIAGNOSIS — M5416 Radiculopathy, lumbar region: Secondary | ICD-10-CM | POA: Diagnosis not present

## 2022-07-22 DIAGNOSIS — M9905 Segmental and somatic dysfunction of pelvic region: Secondary | ICD-10-CM | POA: Diagnosis not present

## 2022-07-22 DIAGNOSIS — M9903 Segmental and somatic dysfunction of lumbar region: Secondary | ICD-10-CM | POA: Diagnosis not present

## 2022-07-22 DIAGNOSIS — M955 Acquired deformity of pelvis: Secondary | ICD-10-CM | POA: Diagnosis not present

## 2022-07-24 DIAGNOSIS — E1169 Type 2 diabetes mellitus with other specified complication: Secondary | ICD-10-CM | POA: Diagnosis not present

## 2022-07-24 DIAGNOSIS — M9905 Segmental and somatic dysfunction of pelvic region: Secondary | ICD-10-CM | POA: Diagnosis not present

## 2022-07-24 DIAGNOSIS — M9903 Segmental and somatic dysfunction of lumbar region: Secondary | ICD-10-CM | POA: Diagnosis not present

## 2022-07-24 DIAGNOSIS — M955 Acquired deformity of pelvis: Secondary | ICD-10-CM | POA: Diagnosis not present

## 2022-07-24 DIAGNOSIS — N183 Chronic kidney disease, stage 3 unspecified: Secondary | ICD-10-CM | POA: Diagnosis not present

## 2022-07-24 DIAGNOSIS — M5416 Radiculopathy, lumbar region: Secondary | ICD-10-CM | POA: Diagnosis not present

## 2022-07-24 DIAGNOSIS — E1159 Type 2 diabetes mellitus with other circulatory complications: Secondary | ICD-10-CM | POA: Diagnosis not present

## 2022-07-24 DIAGNOSIS — I152 Hypertension secondary to endocrine disorders: Secondary | ICD-10-CM | POA: Diagnosis not present

## 2022-07-24 DIAGNOSIS — Z23 Encounter for immunization: Secondary | ICD-10-CM | POA: Diagnosis not present

## 2022-07-24 DIAGNOSIS — E785 Hyperlipidemia, unspecified: Secondary | ICD-10-CM | POA: Diagnosis not present

## 2022-07-24 DIAGNOSIS — E1122 Type 2 diabetes mellitus with diabetic chronic kidney disease: Secondary | ICD-10-CM | POA: Diagnosis not present

## 2022-07-30 DIAGNOSIS — M79604 Pain in right leg: Secondary | ICD-10-CM | POA: Diagnosis not present

## 2022-07-30 DIAGNOSIS — M25551 Pain in right hip: Secondary | ICD-10-CM | POA: Diagnosis not present

## 2022-07-30 DIAGNOSIS — Z79899 Other long term (current) drug therapy: Secondary | ICD-10-CM | POA: Diagnosis not present

## 2022-07-30 DIAGNOSIS — M79605 Pain in left leg: Secondary | ICD-10-CM | POA: Diagnosis not present

## 2022-07-30 DIAGNOSIS — M25552 Pain in left hip: Secondary | ICD-10-CM | POA: Diagnosis not present

## 2022-08-19 DIAGNOSIS — M79605 Pain in left leg: Secondary | ICD-10-CM | POA: Diagnosis not present

## 2022-08-21 ENCOUNTER — Other Ambulatory Visit: Payer: Self-pay | Admitting: Internal Medicine

## 2022-08-21 DIAGNOSIS — M79605 Pain in left leg: Secondary | ICD-10-CM

## 2022-08-22 DIAGNOSIS — M47818 Spondylosis without myelopathy or radiculopathy, sacral and sacrococcygeal region: Secondary | ICD-10-CM | POA: Diagnosis not present

## 2022-08-22 DIAGNOSIS — M1612 Unilateral primary osteoarthritis, left hip: Secondary | ICD-10-CM | POA: Diagnosis not present

## 2022-08-22 DIAGNOSIS — M76892 Other specified enthesopathies of left lower limb, excluding foot: Secondary | ICD-10-CM | POA: Diagnosis not present

## 2022-08-22 DIAGNOSIS — S76912A Strain of unspecified muscles, fascia and tendons at thigh level, left thigh, initial encounter: Secondary | ICD-10-CM | POA: Diagnosis not present

## 2022-08-22 DIAGNOSIS — M5137 Other intervertebral disc degeneration, lumbosacral region: Secondary | ICD-10-CM | POA: Diagnosis not present

## 2022-08-22 DIAGNOSIS — M47816 Spondylosis without myelopathy or radiculopathy, lumbar region: Secondary | ICD-10-CM | POA: Diagnosis not present

## 2022-08-25 ENCOUNTER — Ambulatory Visit: Payer: Medicare HMO

## 2022-09-18 DIAGNOSIS — M6281 Muscle weakness (generalized): Secondary | ICD-10-CM | POA: Diagnosis not present

## 2022-09-18 DIAGNOSIS — M1612 Unilateral primary osteoarthritis, left hip: Secondary | ICD-10-CM | POA: Diagnosis not present

## 2022-09-18 DIAGNOSIS — M47816 Spondylosis without myelopathy or radiculopathy, lumbar region: Secondary | ICD-10-CM | POA: Diagnosis not present

## 2022-09-23 DIAGNOSIS — M47818 Spondylosis without myelopathy or radiculopathy, sacral and sacrococcygeal region: Secondary | ICD-10-CM | POA: Diagnosis not present

## 2022-09-23 DIAGNOSIS — M76892 Other specified enthesopathies of left lower limb, excluding foot: Secondary | ICD-10-CM | POA: Diagnosis not present

## 2022-09-23 DIAGNOSIS — M1612 Unilateral primary osteoarthritis, left hip: Secondary | ICD-10-CM | POA: Diagnosis not present

## 2022-09-23 DIAGNOSIS — M47816 Spondylosis without myelopathy or radiculopathy, lumbar region: Secondary | ICD-10-CM | POA: Diagnosis not present

## 2022-09-23 DIAGNOSIS — M25552 Pain in left hip: Secondary | ICD-10-CM | POA: Diagnosis not present

## 2022-09-23 DIAGNOSIS — M5137 Other intervertebral disc degeneration, lumbosacral region: Secondary | ICD-10-CM | POA: Diagnosis not present

## 2022-09-26 DIAGNOSIS — M6281 Muscle weakness (generalized): Secondary | ICD-10-CM | POA: Diagnosis not present

## 2022-09-26 DIAGNOSIS — M1612 Unilateral primary osteoarthritis, left hip: Secondary | ICD-10-CM | POA: Diagnosis not present

## 2022-09-26 DIAGNOSIS — M47816 Spondylosis without myelopathy or radiculopathy, lumbar region: Secondary | ICD-10-CM | POA: Diagnosis not present

## 2022-09-29 DIAGNOSIS — Z9861 Coronary angioplasty status: Secondary | ICD-10-CM | POA: Diagnosis not present

## 2022-09-29 DIAGNOSIS — E785 Hyperlipidemia, unspecified: Secondary | ICD-10-CM | POA: Diagnosis not present

## 2022-09-29 DIAGNOSIS — Z79899 Other long term (current) drug therapy: Secondary | ICD-10-CM | POA: Diagnosis not present

## 2022-09-29 DIAGNOSIS — I2510 Atherosclerotic heart disease of native coronary artery without angina pectoris: Secondary | ICD-10-CM | POA: Diagnosis not present

## 2022-09-29 DIAGNOSIS — E1169 Type 2 diabetes mellitus with other specified complication: Secondary | ICD-10-CM | POA: Diagnosis not present

## 2022-09-29 DIAGNOSIS — E1122 Type 2 diabetes mellitus with diabetic chronic kidney disease: Secondary | ICD-10-CM | POA: Diagnosis not present

## 2022-09-29 DIAGNOSIS — I10 Essential (primary) hypertension: Secondary | ICD-10-CM | POA: Diagnosis not present

## 2022-09-29 DIAGNOSIS — Z0000 Encounter for general adult medical examination without abnormal findings: Secondary | ICD-10-CM | POA: Diagnosis not present

## 2022-09-29 DIAGNOSIS — N1832 Chronic kidney disease, stage 3b: Secondary | ICD-10-CM | POA: Diagnosis not present

## 2022-09-30 DIAGNOSIS — M47816 Spondylosis without myelopathy or radiculopathy, lumbar region: Secondary | ICD-10-CM | POA: Diagnosis not present

## 2022-09-30 DIAGNOSIS — M6281 Muscle weakness (generalized): Secondary | ICD-10-CM | POA: Diagnosis not present

## 2022-09-30 DIAGNOSIS — M1612 Unilateral primary osteoarthritis, left hip: Secondary | ICD-10-CM | POA: Diagnosis not present

## 2022-10-16 DIAGNOSIS — M1612 Unilateral primary osteoarthritis, left hip: Secondary | ICD-10-CM | POA: Diagnosis not present

## 2022-10-16 DIAGNOSIS — M47816 Spondylosis without myelopathy or radiculopathy, lumbar region: Secondary | ICD-10-CM | POA: Diagnosis not present

## 2022-10-16 DIAGNOSIS — M6281 Muscle weakness (generalized): Secondary | ICD-10-CM | POA: Diagnosis not present

## 2022-10-21 DIAGNOSIS — M76892 Other specified enthesopathies of left lower limb, excluding foot: Secondary | ICD-10-CM | POA: Diagnosis not present

## 2022-10-21 DIAGNOSIS — S76912A Strain of unspecified muscles, fascia and tendons at thigh level, left thigh, initial encounter: Secondary | ICD-10-CM | POA: Diagnosis not present

## 2022-10-21 DIAGNOSIS — M47816 Spondylosis without myelopathy or radiculopathy, lumbar region: Secondary | ICD-10-CM | POA: Diagnosis not present

## 2022-10-21 DIAGNOSIS — M5137 Other intervertebral disc degeneration, lumbosacral region: Secondary | ICD-10-CM | POA: Diagnosis not present

## 2022-10-21 DIAGNOSIS — M47818 Spondylosis without myelopathy or radiculopathy, sacral and sacrococcygeal region: Secondary | ICD-10-CM | POA: Diagnosis not present

## 2022-10-21 DIAGNOSIS — M1612 Unilateral primary osteoarthritis, left hip: Secondary | ICD-10-CM | POA: Diagnosis not present

## 2022-10-21 DIAGNOSIS — M25552 Pain in left hip: Secondary | ICD-10-CM | POA: Diagnosis not present

## 2022-10-23 DIAGNOSIS — M47816 Spondylosis without myelopathy or radiculopathy, lumbar region: Secondary | ICD-10-CM | POA: Diagnosis not present

## 2022-11-04 DIAGNOSIS — M47816 Spondylosis without myelopathy or radiculopathy, lumbar region: Secondary | ICD-10-CM | POA: Diagnosis not present

## 2022-11-04 DIAGNOSIS — M6281 Muscle weakness (generalized): Secondary | ICD-10-CM | POA: Diagnosis not present

## 2022-11-04 DIAGNOSIS — M1612 Unilateral primary osteoarthritis, left hip: Secondary | ICD-10-CM | POA: Diagnosis not present

## 2022-11-13 DIAGNOSIS — M47816 Spondylosis without myelopathy or radiculopathy, lumbar region: Secondary | ICD-10-CM | POA: Diagnosis not present

## 2022-11-27 DIAGNOSIS — M47816 Spondylosis without myelopathy or radiculopathy, lumbar region: Secondary | ICD-10-CM | POA: Diagnosis not present

## 2022-12-04 DIAGNOSIS — E1159 Type 2 diabetes mellitus with other circulatory complications: Secondary | ICD-10-CM | POA: Diagnosis not present

## 2022-12-04 DIAGNOSIS — I152 Hypertension secondary to endocrine disorders: Secondary | ICD-10-CM | POA: Diagnosis not present

## 2022-12-04 DIAGNOSIS — N183 Chronic kidney disease, stage 3 unspecified: Secondary | ICD-10-CM | POA: Diagnosis not present

## 2022-12-04 DIAGNOSIS — E785 Hyperlipidemia, unspecified: Secondary | ICD-10-CM | POA: Diagnosis not present

## 2022-12-04 DIAGNOSIS — E1169 Type 2 diabetes mellitus with other specified complication: Secondary | ICD-10-CM | POA: Diagnosis not present

## 2022-12-04 DIAGNOSIS — E1122 Type 2 diabetes mellitus with diabetic chronic kidney disease: Secondary | ICD-10-CM | POA: Diagnosis not present

## 2023-01-13 DIAGNOSIS — E785 Hyperlipidemia, unspecified: Secondary | ICD-10-CM | POA: Diagnosis not present

## 2023-01-13 DIAGNOSIS — I2510 Atherosclerotic heart disease of native coronary artery without angina pectoris: Secondary | ICD-10-CM | POA: Diagnosis not present

## 2023-01-13 DIAGNOSIS — N183 Chronic kidney disease, stage 3 unspecified: Secondary | ICD-10-CM | POA: Diagnosis not present

## 2023-01-13 DIAGNOSIS — I152 Hypertension secondary to endocrine disorders: Secondary | ICD-10-CM | POA: Diagnosis not present

## 2023-01-13 DIAGNOSIS — Z9861 Coronary angioplasty status: Secondary | ICD-10-CM | POA: Diagnosis not present

## 2023-01-13 DIAGNOSIS — E1159 Type 2 diabetes mellitus with other circulatory complications: Secondary | ICD-10-CM | POA: Diagnosis not present

## 2023-01-13 DIAGNOSIS — E1169 Type 2 diabetes mellitus with other specified complication: Secondary | ICD-10-CM | POA: Diagnosis not present

## 2023-01-13 DIAGNOSIS — E1122 Type 2 diabetes mellitus with diabetic chronic kidney disease: Secondary | ICD-10-CM | POA: Diagnosis not present

## 2023-01-13 DIAGNOSIS — I5022 Chronic systolic (congestive) heart failure: Secondary | ICD-10-CM | POA: Diagnosis not present

## 2023-02-02 DIAGNOSIS — Z0000 Encounter for general adult medical examination without abnormal findings: Secondary | ICD-10-CM | POA: Diagnosis not present

## 2023-02-02 DIAGNOSIS — Z79899 Other long term (current) drug therapy: Secondary | ICD-10-CM | POA: Diagnosis not present

## 2023-02-02 DIAGNOSIS — E1122 Type 2 diabetes mellitus with diabetic chronic kidney disease: Secondary | ICD-10-CM | POA: Diagnosis not present

## 2023-02-02 DIAGNOSIS — N189 Chronic kidney disease, unspecified: Secondary | ICD-10-CM | POA: Diagnosis not present

## 2023-02-02 DIAGNOSIS — Z9861 Coronary angioplasty status: Secondary | ICD-10-CM | POA: Diagnosis not present

## 2023-02-02 DIAGNOSIS — E785 Hyperlipidemia, unspecified: Secondary | ICD-10-CM | POA: Diagnosis not present

## 2023-02-02 DIAGNOSIS — I129 Hypertensive chronic kidney disease with stage 1 through stage 4 chronic kidney disease, or unspecified chronic kidney disease: Secondary | ICD-10-CM | POA: Diagnosis not present

## 2023-02-02 DIAGNOSIS — I2510 Atherosclerotic heart disease of native coronary artery without angina pectoris: Secondary | ICD-10-CM | POA: Diagnosis not present

## 2023-02-24 DIAGNOSIS — M47816 Spondylosis without myelopathy or radiculopathy, lumbar region: Secondary | ICD-10-CM | POA: Diagnosis not present

## 2023-02-24 DIAGNOSIS — M47818 Spondylosis without myelopathy or radiculopathy, sacral and sacrococcygeal region: Secondary | ICD-10-CM | POA: Diagnosis not present

## 2023-02-24 DIAGNOSIS — M5137 Other intervertebral disc degeneration, lumbosacral region: Secondary | ICD-10-CM | POA: Diagnosis not present

## 2023-02-24 DIAGNOSIS — M1612 Unilateral primary osteoarthritis, left hip: Secondary | ICD-10-CM | POA: Diagnosis not present

## 2023-02-24 DIAGNOSIS — M76892 Other specified enthesopathies of left lower limb, excluding foot: Secondary | ICD-10-CM | POA: Diagnosis not present

## 2023-02-24 DIAGNOSIS — M25552 Pain in left hip: Secondary | ICD-10-CM | POA: Diagnosis not present

## 2023-03-27 DIAGNOSIS — M6281 Muscle weakness (generalized): Secondary | ICD-10-CM | POA: Diagnosis not present

## 2023-03-27 DIAGNOSIS — M1612 Unilateral primary osteoarthritis, left hip: Secondary | ICD-10-CM | POA: Diagnosis not present

## 2023-03-27 DIAGNOSIS — M47816 Spondylosis without myelopathy or radiculopathy, lumbar region: Secondary | ICD-10-CM | POA: Diagnosis not present

## 2023-04-01 DIAGNOSIS — M47816 Spondylosis without myelopathy or radiculopathy, lumbar region: Secondary | ICD-10-CM | POA: Diagnosis not present

## 2023-04-09 DIAGNOSIS — M1612 Unilateral primary osteoarthritis, left hip: Secondary | ICD-10-CM | POA: Diagnosis not present

## 2023-04-09 DIAGNOSIS — M47816 Spondylosis without myelopathy or radiculopathy, lumbar region: Secondary | ICD-10-CM | POA: Diagnosis not present

## 2023-04-09 DIAGNOSIS — M6281 Muscle weakness (generalized): Secondary | ICD-10-CM | POA: Diagnosis not present

## 2023-04-17 DIAGNOSIS — M47816 Spondylosis without myelopathy or radiculopathy, lumbar region: Secondary | ICD-10-CM | POA: Diagnosis not present

## 2023-04-17 DIAGNOSIS — M1612 Unilateral primary osteoarthritis, left hip: Secondary | ICD-10-CM | POA: Diagnosis not present

## 2023-04-17 DIAGNOSIS — M6281 Muscle weakness (generalized): Secondary | ICD-10-CM | POA: Diagnosis not present

## 2023-04-28 DIAGNOSIS — E785 Hyperlipidemia, unspecified: Secondary | ICD-10-CM | POA: Diagnosis not present

## 2023-04-28 DIAGNOSIS — Z9861 Coronary angioplasty status: Secondary | ICD-10-CM | POA: Diagnosis not present

## 2023-04-28 DIAGNOSIS — I10 Essential (primary) hypertension: Secondary | ICD-10-CM | POA: Diagnosis not present

## 2023-04-28 DIAGNOSIS — N183 Chronic kidney disease, stage 3 unspecified: Secondary | ICD-10-CM | POA: Diagnosis not present

## 2023-04-28 DIAGNOSIS — I152 Hypertension secondary to endocrine disorders: Secondary | ICD-10-CM | POA: Diagnosis not present

## 2023-04-28 DIAGNOSIS — Z79899 Other long term (current) drug therapy: Secondary | ICD-10-CM | POA: Diagnosis not present

## 2023-04-28 DIAGNOSIS — E1169 Type 2 diabetes mellitus with other specified complication: Secondary | ICD-10-CM | POA: Diagnosis not present

## 2023-04-28 DIAGNOSIS — I2510 Atherosclerotic heart disease of native coronary artery without angina pectoris: Secondary | ICD-10-CM | POA: Diagnosis not present

## 2023-04-28 DIAGNOSIS — E1122 Type 2 diabetes mellitus with diabetic chronic kidney disease: Secondary | ICD-10-CM | POA: Diagnosis not present

## 2023-04-28 DIAGNOSIS — E1159 Type 2 diabetes mellitus with other circulatory complications: Secondary | ICD-10-CM | POA: Diagnosis not present

## 2023-04-30 DIAGNOSIS — M47816 Spondylosis without myelopathy or radiculopathy, lumbar region: Secondary | ICD-10-CM | POA: Diagnosis not present

## 2023-05-02 DIAGNOSIS — S81812A Laceration without foreign body, left lower leg, initial encounter: Secondary | ICD-10-CM | POA: Diagnosis not present

## 2023-05-02 DIAGNOSIS — L089 Local infection of the skin and subcutaneous tissue, unspecified: Secondary | ICD-10-CM | POA: Diagnosis not present

## 2023-05-05 DIAGNOSIS — Z79899 Other long term (current) drug therapy: Secondary | ICD-10-CM | POA: Diagnosis not present

## 2023-05-05 DIAGNOSIS — N183 Chronic kidney disease, stage 3 unspecified: Secondary | ICD-10-CM | POA: Diagnosis not present

## 2023-05-05 DIAGNOSIS — E1169 Type 2 diabetes mellitus with other specified complication: Secondary | ICD-10-CM | POA: Diagnosis not present

## 2023-05-05 DIAGNOSIS — E785 Hyperlipidemia, unspecified: Secondary | ICD-10-CM | POA: Diagnosis not present

## 2023-05-05 DIAGNOSIS — I152 Hypertension secondary to endocrine disorders: Secondary | ICD-10-CM | POA: Diagnosis not present

## 2023-05-05 DIAGNOSIS — E1122 Type 2 diabetes mellitus with diabetic chronic kidney disease: Secondary | ICD-10-CM | POA: Diagnosis not present

## 2023-05-05 DIAGNOSIS — E1159 Type 2 diabetes mellitus with other circulatory complications: Secondary | ICD-10-CM | POA: Diagnosis not present

## 2023-05-08 DIAGNOSIS — M47816 Spondylosis without myelopathy or radiculopathy, lumbar region: Secondary | ICD-10-CM | POA: Diagnosis not present

## 2023-05-28 DIAGNOSIS — H43811 Vitreous degeneration, right eye: Secondary | ICD-10-CM | POA: Diagnosis not present

## 2023-05-28 DIAGNOSIS — E119 Type 2 diabetes mellitus without complications: Secondary | ICD-10-CM | POA: Diagnosis not present

## 2023-05-28 DIAGNOSIS — H35371 Puckering of macula, right eye: Secondary | ICD-10-CM | POA: Diagnosis not present

## 2023-05-28 DIAGNOSIS — H43813 Vitreous degeneration, bilateral: Secondary | ICD-10-CM | POA: Diagnosis not present

## 2023-05-28 DIAGNOSIS — H43812 Vitreous degeneration, left eye: Secondary | ICD-10-CM | POA: Diagnosis not present

## 2023-05-28 DIAGNOSIS — Z0100 Encounter for examination of eyes and vision without abnormal findings: Secondary | ICD-10-CM | POA: Diagnosis not present

## 2023-06-07 DIAGNOSIS — Z03818 Encounter for observation for suspected exposure to other biological agents ruled out: Secondary | ICD-10-CM | POA: Diagnosis not present

## 2023-06-07 DIAGNOSIS — J029 Acute pharyngitis, unspecified: Secondary | ICD-10-CM | POA: Diagnosis not present

## 2023-06-08 DIAGNOSIS — Z79899 Other long term (current) drug therapy: Secondary | ICD-10-CM | POA: Diagnosis not present

## 2023-06-12 DIAGNOSIS — B370 Candidal stomatitis: Secondary | ICD-10-CM | POA: Diagnosis not present

## 2023-06-12 DIAGNOSIS — B3781 Candidal esophagitis: Secondary | ICD-10-CM | POA: Diagnosis not present

## 2023-06-13 DIAGNOSIS — I469 Cardiac arrest, cause unspecified: Secondary | ICD-10-CM | POA: Diagnosis not present

## 2023-06-14 DEATH — deceased
